# Patient Record
Sex: Female | Born: 1980 | Race: White | Hispanic: No | Marital: Married | Smoking: Never smoker
Health system: Southern US, Community
[De-identification: ages and names within clinical notes are randomized; demographics above are authoritative.]

## PROBLEM LIST (undated history)

## (undated) DIAGNOSIS — D649 Anemia, unspecified: Secondary | ICD-10-CM

## (undated) DIAGNOSIS — F419 Anxiety disorder, unspecified: Secondary | ICD-10-CM

## (undated) DIAGNOSIS — K219 Gastro-esophageal reflux disease without esophagitis: Secondary | ICD-10-CM

## (undated) DIAGNOSIS — F32A Depression, unspecified: Secondary | ICD-10-CM

## (undated) DIAGNOSIS — T7840XA Allergy, unspecified, initial encounter: Secondary | ICD-10-CM

## (undated) HISTORY — DX: Allergy, unspecified, initial encounter: T78.40XA

## (undated) HISTORY — DX: Anemia, unspecified: D64.9

## (undated) HISTORY — DX: Depression, unspecified: F32.A

## (undated) HISTORY — DX: Anxiety disorder, unspecified: F41.9

## (undated) HISTORY — PX: CHOLECYSTECTOMY: SHX55

---

## 2006-03-29 DIAGNOSIS — K219 Gastro-esophageal reflux disease without esophagitis: Secondary | ICD-10-CM | POA: Insufficient documentation

## 2014-03-28 DIAGNOSIS — E669 Obesity, unspecified: Secondary | ICD-10-CM | POA: Insufficient documentation

## 2015-08-10 DIAGNOSIS — J4520 Mild intermittent asthma, uncomplicated: Secondary | ICD-10-CM | POA: Insufficient documentation

## 2017-05-15 DIAGNOSIS — O034 Incomplete spontaneous abortion without complication: Secondary | ICD-10-CM | POA: Insufficient documentation

## 2017-11-18 ENCOUNTER — Other Ambulatory Visit: Payer: Self-pay | Admitting: Urology

## 2017-11-18 ENCOUNTER — Other Ambulatory Visit: Payer: Self-pay | Admitting: Physician Assistant

## 2017-11-18 DIAGNOSIS — R319 Hematuria, unspecified: Secondary | ICD-10-CM

## 2017-11-27 ENCOUNTER — Ambulatory Visit: Admission: RE | Admit: 2017-11-27 | Payer: Self-pay | Source: Ambulatory Visit

## 2017-12-31 ENCOUNTER — Ambulatory Visit: Payer: BC Managed Care – PPO

## 2018-01-10 ENCOUNTER — Ambulatory Visit
Admission: RE | Admit: 2018-01-10 | Discharge: 2018-01-10 | Disposition: A | Discharge status: Home / Self Care | Payer: BC Managed Care – PPO | Source: Ambulatory Visit | Attending: Physician Assistant | Admitting: Physician Assistant

## 2018-01-10 DIAGNOSIS — R319 Hematuria, unspecified: Secondary | ICD-10-CM

## 2018-01-13 ENCOUNTER — Ambulatory Visit
Admission: RE | Admit: 2018-01-13 | Discharge: 2018-01-13 | Disposition: A | Discharge status: Home / Self Care | Payer: BC Managed Care – PPO | Source: Ambulatory Visit | Attending: Physician Assistant | Admitting: Physician Assistant

## 2018-01-13 DIAGNOSIS — R319 Hematuria, unspecified: Secondary | ICD-10-CM | POA: Insufficient documentation

## 2018-01-13 DIAGNOSIS — R109 Unspecified abdominal pain: Secondary | ICD-10-CM | POA: Insufficient documentation

## 2019-09-13 IMAGING — CT CT ABD-PELV W/O CM
2 of 4 series · 16 of 46 positions shown, 18 images · non-contrast
Comparison: None.

CLINICAL DATA: Left-sided abdominal pain and swelling for 2-3
months. Microscopic hematuria.

EXAM:
CT ABDOMEN AND PELVIS WITHOUT CONTRAST
TECHNIQUE: Multidetector CT imaging of the abdomen and pelvis was performed
following the standard protocol without IV contrast.

[Series 2: renal stone · axial · 0.74mm/px · z∈[-1472,-1032]mm · 13 of 96 slices shown, 15 images (1 of 2)]
[im 4/96  soft-tissue]
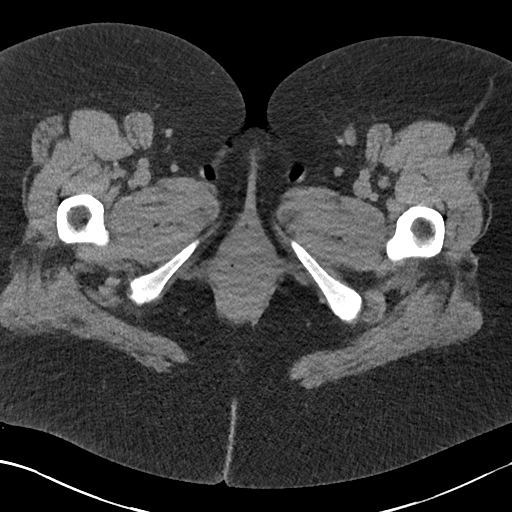
[im 4/96  bone]
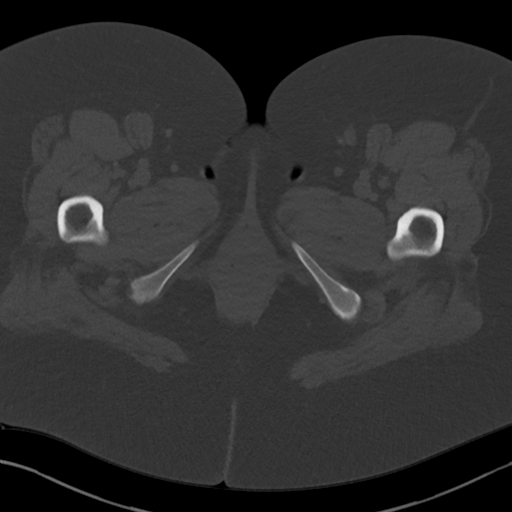
[im 12/96  soft-tissue]
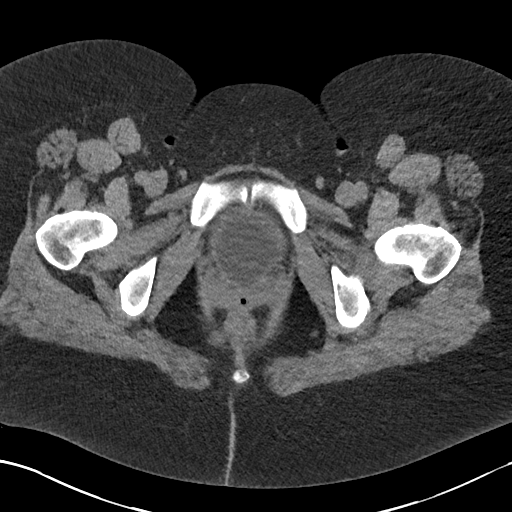
[im 20/96  soft-tissue]
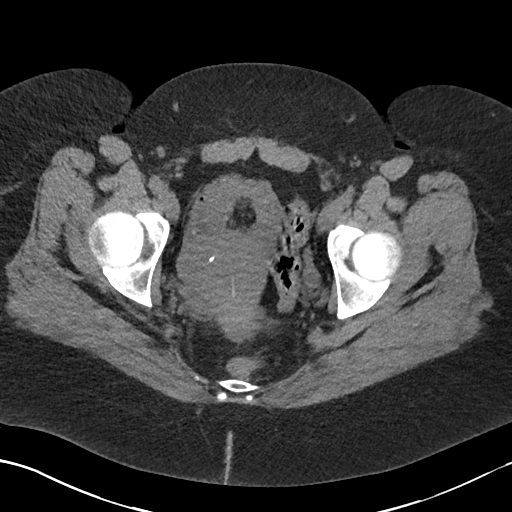
[im 28/96  soft-tissue]
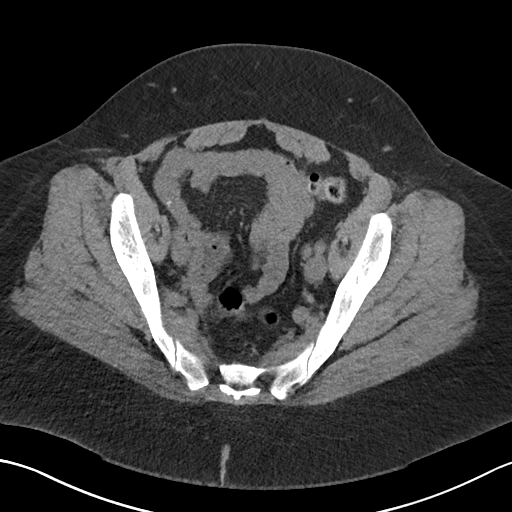
[im 32/96  soft-tissue]
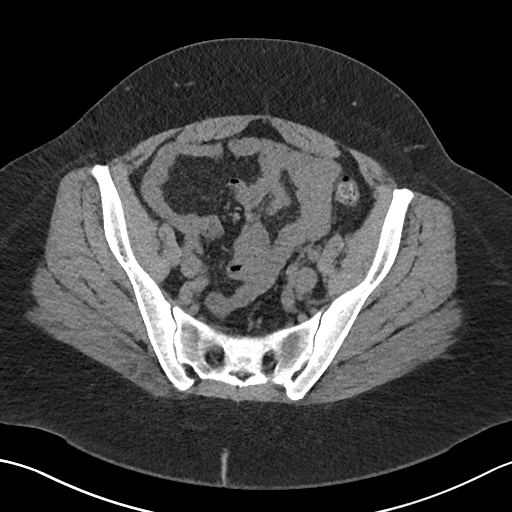
[im 40/96  soft-tissue]
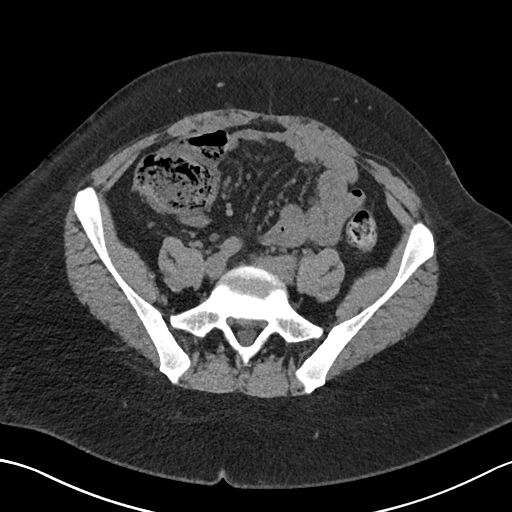
[im 48/96  soft-tissue]
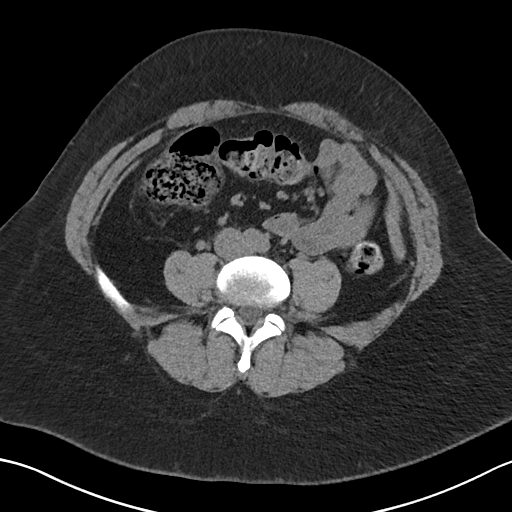
[im 56/96  soft-tissue]
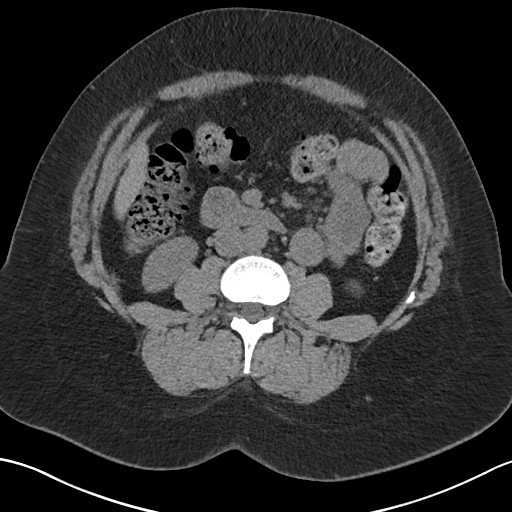
[im 64/96  soft-tissue]
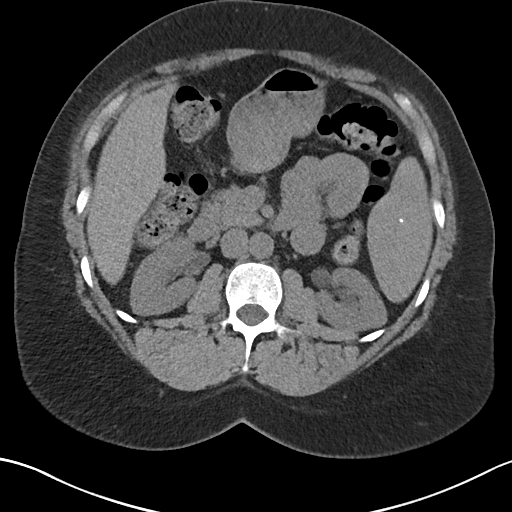
[im 64/96  bone]
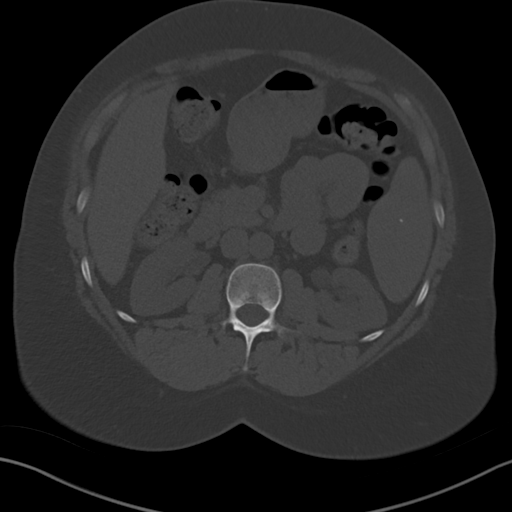
[im 68/96  soft-tissue]
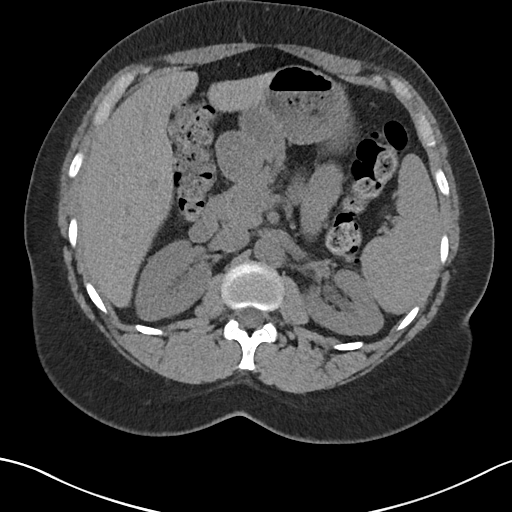
[im 76/96  soft-tissue]
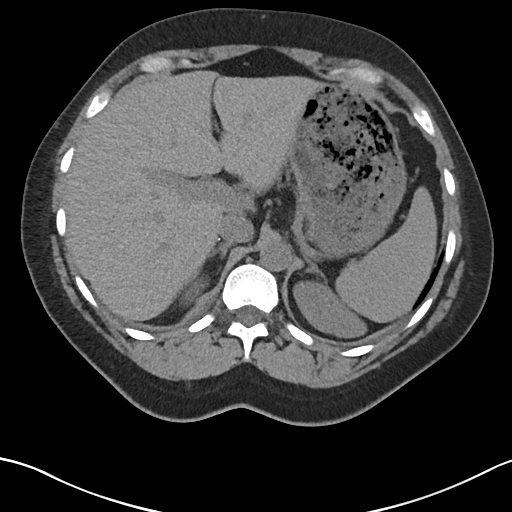
[im 84/96  soft-tissue]
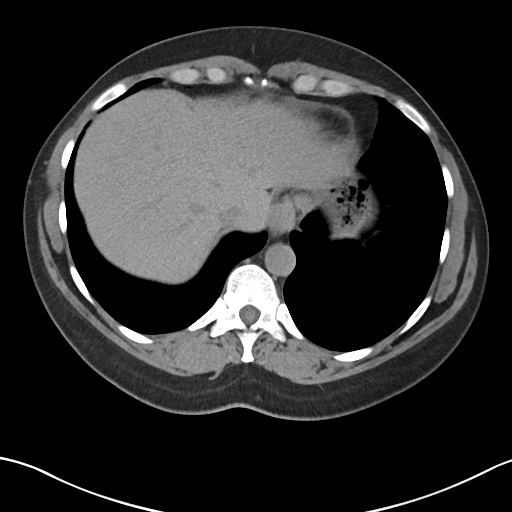
[im 92/96  soft-tissue]
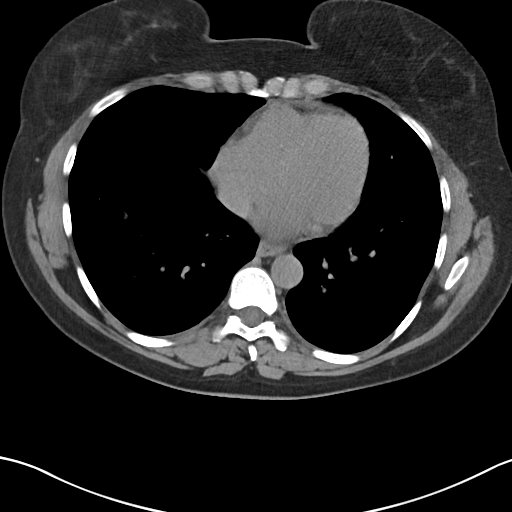

[Series 4: renal stone · coronal · 0.74mm/px · 3 of 189 slices shown (2 of 2)]
[im 63/189  soft-tissue]
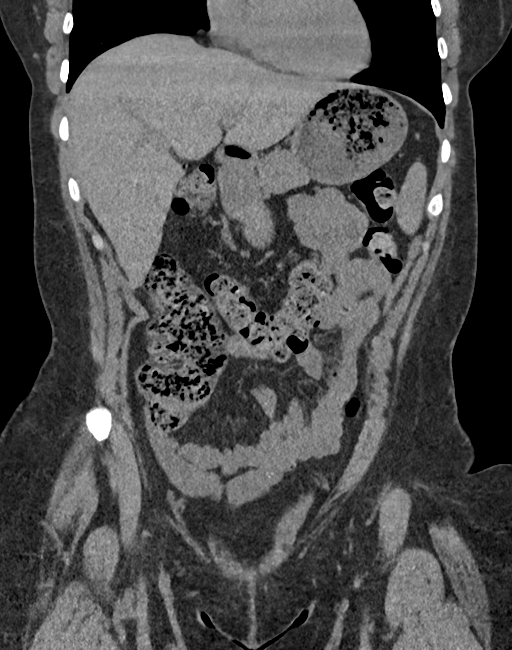
[im 84/189  soft-tissue]
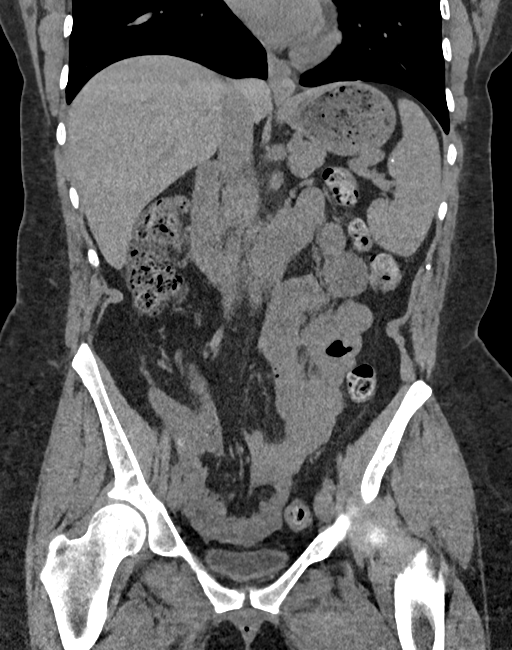
[im 105/189  soft-tissue]
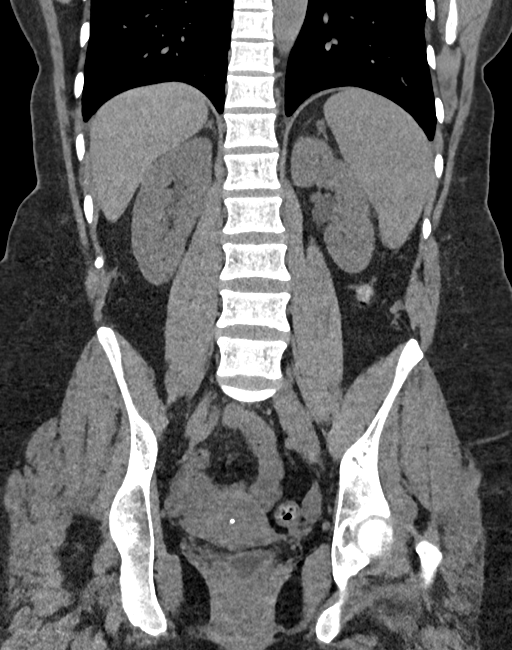

[16 of 46 positions shown; findings below may reference images not displayed]

FINDINGS: Lower chest: Clear lung bases.

Hepatobiliary: No focal liver abnormality is seen. Status post
cholecystectomy. No biliary dilatation.

Pancreas: Unremarkable.

Spleen: Small calcified granulomas.

Adrenals/Urinary Tract: Unremarkable adrenal glands. No evidence of
renal mass, urolithiasis, or hydroureteronephrosis. Unremarkable
bladder.

Stomach/Bowel: The stomach is within normal limits. There is no
evidence of bowel obstruction. The appendix is not clearly
identified, however no inflammatory changes are seen in the right
lower quadrant. A moderate amount of stool is noted in the proximal
colon.

Vascular/Lymphatic: Normal caliber of the abdominal aorta. No
enlarged lymph nodes.

Reproductive: IUD in place. No adnexal mass.

Other: No intraperitoneal free fluid.

Musculoskeletal: No acute osseous abnormality or suspicious osseous
lesion.
IMPRESSION: No acute abnormality identified in the abdomen or pelvis.

## 2021-05-06 ENCOUNTER — Other Ambulatory Visit: Payer: Self-pay

## 2021-05-06 ENCOUNTER — Ambulatory Visit
Admission: EM | Admit: 2021-05-06 | Discharge: 2021-05-06 | Disposition: A | Discharge status: Home / Self Care | Payer: 59

## 2021-05-06 ENCOUNTER — Encounter: Payer: Self-pay | Admitting: Emergency Medicine

## 2021-05-06 DIAGNOSIS — J029 Acute pharyngitis, unspecified: Secondary | ICD-10-CM

## 2021-05-06 DIAGNOSIS — H9203 Otalgia, bilateral: Secondary | ICD-10-CM

## 2021-05-06 DIAGNOSIS — R079 Chest pain, unspecified: Secondary | ICD-10-CM | POA: Diagnosis not present

## 2021-05-06 DIAGNOSIS — K219 Gastro-esophageal reflux disease without esophagitis: Secondary | ICD-10-CM

## 2021-05-06 HISTORY — DX: Gastro-esophageal reflux disease without esophagitis: K21.9

## 2021-05-06 NOTE — ED Provider Notes (Signed)
?[facility] ? ? ? ?CSN: [id number] ?Arrival date & time: 05/06/21  1058 ? ? ?  ? ?History   ?Chief Complaint ?Chief Complaint  ?Patient presents with  ? Abdominal Pain  ?  Rib Pain  ? Chest Pain  ? ? ?HPI ?Lauren Kidd is a 41 y.o. female.  Patient presents with intermittent pain in her left lower anterior rib cage x2 weeks.  No falls or injury.  No rash, bruising, redness, chest pain, shortness of breath.  The pain is worse with twisting and deep breaths.  She also reports bilateral ear pain, sore throat, and heartburn x2 weeks.  Treatment at home with Nexium; occasionally taking Tylenol but none today.  She has an appointment scheduled with gastroenterology in April and with her PCP on 05/15/2021.  She denies fever, chills, abdominal pain, vomiting, diarrhea, constipation, or other symptoms.  Her medical history includes GERD and morbid obesity. ? ?The history is provided by the patient and medical records.  ? ?Past Medical History:  ?Diagnosis Date  ? GERD (gastroesophageal reflux disease)   ? ? ?Patient Active Problem List  ? Diagnosis Date Noted  ? Incomplete abortion 05/15/2017  ? RAD (reactive airway disease) with wheezing, mild intermittent, uncomplicated 08/10/2015  ? Obesity 03/28/2014  ? Acid reflux 03/29/2006  ? ? ?History reviewed. No pertinent surgical history. ? ?OB History   ?No obstetric history on file. ?  ? ? ? ?Home Medications   ? ?Prior to Admission medications   ?Medication Sig Start Date End Date Taking? Authorizing Provider  ?albuterol (VENTOLIN HFA) 108 (90 Base) MCG/ACT inhaler Inhale into the lungs. 07/04/15  Yes [provider]  ?Esomeprazole Magnesium (NEXIUM 24HR CLEAR MINIS PO)  11/26/20  Yes [provider]  ?phentermine 37.5 MG capsule Take by mouth. 08/04/15  Yes [provider]  ?aspirin 325 MG tablet Take by mouth.    [provider]  ?terbinafine (LAMISIL) 250 MG tablet Take by mouth.    [provider]  ? ? ?Family  History ?History reviewed. No pertinent family history. ? ?Social History ?Social History  ? ?Tobacco Use  ? Smoking status: Never  ? Smokeless tobacco: Never  ?Substance Use Topics  ? Alcohol use: Never  ? Drug use: Never  ? ? ? ?Allergies   ?Patient has no known allergies. ? ? ?Review of Systems ?Review of Systems  ?Constitutional:  Negative for chills and fever.  ?HENT:  Positive for ear pain and sore throat.   ?Respiratory:  Negative for cough and shortness of breath.   ?Cardiovascular:  Negative for chest pain and palpitations.  ?Gastrointestinal:  Negative for abdominal pain, constipation, diarrhea, nausea and vomiting.  ?     Heartburn   ?Musculoskeletal:  Negative for back pain and gait problem.  ?     Left ribcage pain  ?Skin:  Negative for color change and rash.  ?All other systems reviewed and are negative. ? ? ?Physical Exam ?Triage Vital Signs ?ED Triage Vitals  ?Enc Vitals Group  ?   BP 05/06/21 1148 127/74  ?   Pulse Rate 05/06/21 1148 93  ?   Resp 05/06/21 1148 18  ?   Temp 05/06/21 1148 98.1 ?F (36.7 ?C)  ?   Temp src --   ?   SpO2 05/06/21 1148 97 %  ?   Weight --   ?   Height --   ?   Head Circumference --   ?   Peak Flow --   ?  Pain Score 05/06/21 1149 6  ?   Pain Loc --   ?   Pain Edu? --   ?   Excl. in GC? --   ? ?No data found. ? ?Updated Vital Signs ?BP 127/74   Pulse 93   Temp 98.1 ?F (36.7 ?C)   Resp 18   SpO2 97%  ? ?Visual Acuity ?Right Eye Distance:   ?Left Eye Distance:   ?Bilateral Distance:   ? ?Right Eye Near:   ?Left Eye Near:    ?Bilateral Near:    ? ?Physical Exam ?Vitals and nursing note reviewed.  ?Constitutional:   ?   General: She is not in acute distress. ?   Appearance: She is well-developed. She is obese. She is not ill-appearing.  ?HENT:  ?   Right Ear: Tympanic membrane normal.  ?   Left Ear: Tympanic membrane normal.  ?   Nose: Nose normal.  ?   Mouth/Throat:  ?   Mouth: Mucous membranes are moist.  ?   Pharynx: Oropharynx is clear.  ?Cardiovascular:  ?   Rate and  Rhythm: Normal rate and regular rhythm.  ?   Heart sounds: Normal heart sounds.  ?Pulmonary:  ?   Effort: Pulmonary effort is normal. No respiratory distress.  ?   Breath sounds: Normal breath sounds.  ?Chest:  ? ? ?Abdominal:  ?   General: Bowel sounds are normal.  ?   Palpations: Abdomen is soft.  ?   Tenderness: There is no abdominal tenderness. There is no guarding or rebound.  ?Musculoskeletal:     ?   General: No swelling, tenderness, deformity or signs of injury. Normal range of motion.  ?   Cervical back: Neck supple.  ?   Comments: Ribcage nontender.  ?Skin: ?   General: Skin is warm and dry.  ?   Capillary Refill: Capillary refill takes less than 2 seconds.  ?   Findings: No bruising, erythema, lesion or rash.  ?Neurological:  ?   General: No focal deficit present.  ?   Mental Status: She is alert and oriented to person, place, and time.  ?   Gait: Gait normal.  ?Psychiatric:     ?   Mood and Affect: Mood normal.     ?   Behavior: Behavior normal.  ? ? ? ?UC Treatments / Results  ?Labs ?(all labs ordered are listed, but only abnormal results are displayed) ?Labs Reviewed - No data to display ? ?EKG ? ? ?Radiology ?No results found. ? ?Procedures ?Procedures (including critical care time) ? ?Medications Ordered in UC ?Medications - No data to display ? ?Initial Impression / Assessment and Plan / UC Course  ?I have reviewed the triage vital signs and the nursing notes. ? ?Pertinent labs & imaging results that were available during my care of the patient were reviewed by me and considered in my medical decision making (see chart for details). ? ?  ?Atypical chest pain, GERD, otalgia, sore throat.  EKG shows sinus rhythm, rate 66, no ST elevation, no previous to compare.  ED precautions discussed; instructed patient to go to the ED if she has chest pain or other concerning symptoms.  Instructed her to follow-up with her PCP on Monday.  Patient has an appointment scheduled with her PCP on 05/15/2021 and with a  gastroenterologist in April.  Instructed her to continue taking Nexium.  Education provided on chest pain and GERD.  Patient agrees to plan of care. ? ? ?Final Clinical  Impressions(s) / UC Diagnoses  ? ?Final diagnoses:  ?Chest pain, unspecified type  ?Gastroesophageal reflux disease, unspecified whether esophagitis present  ?Otalgia of both ears  ?Sore throat  ? ? ? ?Discharge Instructions   ? ?  ?Go to the emergency department if you have chest pain or other concerning symptoms.   ? ?Follow-up with your primary care provider on Monday. ? ? ? ? ?ED Prescriptions   ?None ?  ? ?PDMP not reviewed this encounter. ?  ?Mickie Bailate, Lancelot Alyea H, NP ?05/06/21 1235 ? ?

## 2021-05-06 NOTE — ED Triage Notes (Signed)
Pt here with left sided rib/LUQ pain x 2 weeks that is intermittent in nature and worse with movement.  ?

## 2021-05-06 NOTE — Discharge Instructions (Addendum)
Go to the emergency department if you have chest pain or other concerning symptoms.   ? ?Follow-up with your primary care provider on Monday. ?

## 2021-05-08 ENCOUNTER — Encounter: Payer: 59 | Admitting: Family Medicine

## 2021-05-15 ENCOUNTER — Encounter: Payer: Self-pay | Admitting: Registered Nurse

## 2021-05-15 ENCOUNTER — Ambulatory Visit (INDEPENDENT_AMBULATORY_CARE_PROVIDER_SITE_OTHER): Payer: 59 | Admitting: Registered Nurse

## 2021-05-15 VITALS — BP 118/84 | HR 77 | Temp 98.1°F | Resp 18 | Ht 67.0 in | Wt 297.4 lb

## 2021-05-15 DIAGNOSIS — R1012 Left upper quadrant pain: Secondary | ICD-10-CM | POA: Diagnosis not present

## 2021-05-15 DIAGNOSIS — K219 Gastro-esophageal reflux disease without esophagitis: Secondary | ICD-10-CM | POA: Diagnosis not present

## 2021-05-15 DIAGNOSIS — R197 Diarrhea, unspecified: Secondary | ICD-10-CM

## 2021-05-15 DIAGNOSIS — Z6841 Body Mass Index (BMI) 40.0 and over, adult: Secondary | ICD-10-CM

## 2021-05-15 LAB — CBC WITH DIFFERENTIAL/PLATELET
Basophils Absolute: 0 10*3/uL (ref 0.0–0.1)
Basophils Relative: 0.6 % (ref 0.0–3.0)
Eosinophils Absolute: 0.2 10*3/uL (ref 0.0–0.7)
Eosinophils Relative: 3.8 % (ref 0.0–5.0)
HCT: 41.4 % (ref 36.0–46.0)
Hemoglobin: 13.7 g/dL (ref 12.0–15.0)
Lymphocytes Relative: 15.7 % (ref 12.0–46.0)
Lymphs Abs: 1 10*3/uL (ref 0.7–4.0)
MCHC: 33.1 g/dL (ref 30.0–36.0)
MCV: 89.4 fl (ref 78.0–100.0)
Monocytes Absolute: 0.4 10*3/uL (ref 0.1–1.0)
Monocytes Relative: 5.7 % (ref 3.0–12.0)
Neutro Abs: 4.7 10*3/uL (ref 1.4–7.7)
Neutrophils Relative %: 74.2 % (ref 43.0–77.0)
Platelets: 174 10*3/uL (ref 150.0–400.0)
RBC: 4.63 Mil/uL (ref 3.87–5.11)
RDW: 14.2 % (ref 11.5–15.5)
WBC: 6.3 10*3/uL (ref 4.0–10.5)

## 2021-05-15 LAB — COMPREHENSIVE METABOLIC PANEL
ALT: 13 U/L (ref 0–35)
AST: 19 U/L (ref 0–37)
Albumin: 4.4 g/dL (ref 3.5–5.2)
Alkaline Phosphatase: 55 U/L (ref 39–117)
BUN: 8 mg/dL (ref 6–23)
CO2: 26 mEq/L (ref 19–32)
Calcium: 9.1 mg/dL (ref 8.4–10.5)
Chloride: 102 mEq/L (ref 96–112)
Creatinine, Ser: 0.77 mg/dL (ref 0.40–1.20)
GFR: 96.33 mL/min (ref >60.00)
Glucose, Bld: 72 mg/dL (ref 70–99)
Potassium: 4.1 mEq/L (ref 3.5–5.1)
Sodium: 136 mEq/L (ref 135–145)
Total Bilirubin: 0.6 mg/dL (ref 0.2–1.2)
Total Protein: 6.7 g/dL (ref 6.0–8.3)

## 2021-05-15 LAB — LIPID PANEL
Cholesterol: 185 mg/dL (ref 0–200)
HDL: 52.4 mg/dL (ref >39.00)
LDL Cholesterol: 117 mg/dL — ABNORMAL HIGH (ref 0–99)
NonHDL: 132.12
Total CHOL/HDL Ratio: 4
Triglycerides: 77 mg/dL (ref 0.0–149.0)
VLDL: 15.4 mg/dL (ref 0.0–40.0)

## 2021-05-15 LAB — HEMOGLOBIN A1C: Hgb A1c MFr Bld: 5.2 % (ref 4.6–6.5)

## 2021-05-15 LAB — TSH: TSH: 1.36 u[IU]/mL (ref 0.35–5.50)

## 2021-05-15 MED ORDER — DEXLANSOPRAZOLE 60 MG PO CPDR: 60.0000 mg | DELAYED_RELEASE_CAPSULE | Freq: Every day | ORAL | 1 refills | Status: DC

## 2021-05-15 MED ORDER — PHENTERMINE HCL 30 MG PO CAPS: 30.0000 mg | ORAL_CAPSULE | ORAL | 0 refills | Status: DC

## 2021-05-15 MED ORDER — SUCRALFATE 1 G PO TABS: 1.0000 g | ORAL_TABLET | Freq: Three times a day (TID) | ORAL | 0 refills | Status: DC

## 2021-05-15 NOTE — Progress Notes (Signed)
? ?New Patient Office Visit ? ?Subjective:  ?Patient ID: Lauren Kidd, female    DOB: 1980/06/15  Age: 41 y.o. MRN: [id number] ? ?CC:  ?Chief Complaint  ?Patient presents with  ? New Patient (Initial Visit)  ?  Patient states she she is here to establish care. Patient states she is here to discuss weight loss and also stomach pain she has an appointment with Laurette Schimke at the end of April.  ? ? ?HPI ?Lauren Kidd presents for visit to est care. ? ?Abdominal pain ?Ongoing acid reflux, plus on and off ULQ pain. ?Worse with acidic foods.  ?Does take nexium daily. Had used dexilant in the past.  ?No changes to BM - consistently inconsistent. Watery at times. No constipation unless taking imodium. ?No blood or mucus in stool.  ?No nv.  ?No unexpected weight changes.  ? ?Weight management ?Has been on phentermine 37.5 mg po qd in past ?Stimulant too much - pounding heart beat, sleep disturbance ?Interested in lower dose ?We did discuss wegovy in depth and may consider a change in the future.  ? ?Past Medical History:  ?Diagnosis Date  ? Allergy 2008  ? Anemia 2016  ? Anxiety 2016  ? GERD (gastroesophageal reflux disease)   ? ? ?Past Surgical History:  ?Procedure Laterality Date  ? CHOLECYSTECTOMY  2009  ? ? ?Family History  ?Problem Relation Age of Onset  ? Hypertension Maternal Grandmother   ? Depression Other   ? ? ?Social History  ? ?Socioeconomic History  ? Marital status: Married  ?  Spouse name: Not on file  ? Number of children: 1  ? Years of education: Not on file  ? Highest education level: Not on file  ?Occupational History  ? Not on file  ?Tobacco Use  ? Smoking status: Never  ? Smokeless tobacco: Never  ?Vaping Use  ? Vaping Use: Never used  ?Substance and Sexual Activity  ? Alcohol use: Yes  ?  Alcohol/week: 15.0 standard drinks  ?  Types: 15 Shots of liquor per week  ?  Comment: Not currently drinking but was for about two years  ? Drug use: Never  ? Sexual activity: Yes  ?Other Topics Concern  ? Not on file   ?Social History Narrative  ? Not on file  ? ?Social Determinants of Health  ? ?Financial Resource Strain: Not on file  ?Food Insecurity: Not on file  ?Transportation Needs: Not on file  ?Physical Activity: Not on file  ?Stress: Not on file  ?Social Connections: Not on file  ?Intimate Partner Violence: Not on file  ? ? ?ROS ?Review of Systems  ?Constitutional: Negative.   ?HENT: Negative.    ?Eyes: Negative.   ?Respiratory: Negative.    ?Cardiovascular: Negative.   ?Gastrointestinal: Negative.   ?Genitourinary: Negative.   ?Musculoskeletal: Negative.   ?Skin: Negative.   ?Neurological: Negative.   ?Psychiatric/Behavioral: Negative.    ? ?Objective:  ? ?Today's Vitals: BP 118/84   Pulse 77   Temp 98.1 ?F (36.7 ?C) (Temporal)   Resp 18   Ht 5\' 7"  (1.702 m)   Wt 297 lb 6.4 oz (134.9 kg)   SpO2 99%   BMI 46.58 kg/m?  ? ?Physical Exam ?Vitals and nursing note reviewed.  ?Constitutional:   ?   General: She is not in acute distress. ?   Appearance: Normal appearance. She is not ill-appearing, toxic-appearing or diaphoretic.  ?Cardiovascular:  ?   Rate and Rhythm: Normal rate and regular rhythm.  ?  Pulses: Normal pulses.  ?   Heart sounds: Normal heart sounds. No murmur heard. ?  No friction rub. No gallop.  ?Pulmonary:  ?   Effort: Pulmonary effort is normal. No respiratory distress.  ?   Breath sounds: Normal breath sounds. No stridor. No wheezing, rhonchi or rales.  ?Chest:  ?   Chest wall: No tenderness.  ?Skin: ?   General: Skin is warm and dry.  ?   Capillary Refill: Capillary refill takes less than 2 seconds.  ?Neurological:  ?   General: No focal deficit present.  ?   Mental Status: She is alert and oriented to person, place, and time. Mental status is at baseline.  ?Psychiatric:     ?   Mood and Affect: Mood normal.     ?   Behavior: Behavior normal.     ?   Thought Content: Thought content normal.     ?   Judgment: Judgment normal.  ? ? ?Assessment & Plan:  ? ?Problem List Items Addressed This Visit   ? ?   ? Digestive  ? Acid reflux - Primary  ? Relevant Medications  ? dexlansoprazole (DEXILANT) 60 MG capsule  ? sucralfate (CARAFATE) 1 g tablet  ?  ? Other  ? Obesity  ? Relevant Medications  ? phentermine 30 MG capsule  ? ?Other Visit Diagnoses   ? ? LUQ abdominal pain      ? Relevant Orders  ? CBC with Differential/Platelet  ? Comprehensive metabolic panel  ? Lipid panel  ? TSH  ? Hemoglobin A1c  ? Tissue transglutaminase, IgA  ? Intermittent diarrhea      ? Relevant Orders  ? CBC with Differential/Platelet  ? Comprehensive metabolic panel  ? Lipid panel  ? TSH  ? Hemoglobin A1c  ? Tissue transglutaminase, IgA  ? ?  ? ? ?Outpatient Encounter Medications as of 05/15/2021  ?Medication Sig  ? aspirin 325 MG tablet Take by mouth.  ? dexlansoprazole (DEXILANT) 60 MG capsule Take 1 capsule (60 mg total) by mouth daily.  ? Esomeprazole Magnesium (NEXIUM 24HR CLEAR MINIS PO)   ? phentermine 30 MG capsule Take 1 capsule (30 mg total) by mouth every morning.  ? sucralfate (CARAFATE) 1 g tablet Take 1 tablet (1 g total) by mouth 4 (four) times daily -  with meals and at bedtime.  ? [DISCONTINUED] albuterol (VENTOLIN HFA) 108 (90 Base) MCG/ACT inhaler Inhale into the lungs.  ? [DISCONTINUED] phentermine 37.5 MG capsule Take by mouth.  ? [DISCONTINUED] terbinafine (LAMISIL) 250 MG tablet Take by mouth.  ? ?No facility-administered encounter medications on file as of 05/15/2021.  ? ? ?Follow-up: Return in about 6 months (around 11/15/2021) for CPE and labs.  ? ?PLAN ?Dexilant 60mg  po qd and sucralfate po qid for GERD.  ?Phetnermine 30mg  po qd ?Labs collected. Will follow up with the patient as warranted. ?Patient encouraged to call clinic with any questions, comments, or concerns. ? ? , NP ? ?

## 2021-05-15 NOTE — Patient Instructions (Addendum)
Lauren Kidd -  ? ?Great to meet you ? ?Call with any concerns ? ?Phentermine, dexilant, and sucralfate sent. ? ?I will call with any urgent lab results ? ?Thanks, ? ?Rich  ? ? ? ?If you have lab work done today you will be contacted with your lab results within the next 2 weeks.  If you have not heard from [place] then please contact us. The fastest way to get your results is to register for My Chart. ? ? ?IF you received an x-ray today, you will receive an invoice from [facility]. Please contact [facility] at [phone] with questions or concerns regarding your invoice.  ? ?IF you received labwork today, you will receive an invoice from [facility]. Please contact [facility] at [phone] with questions or concerns regarding your invoice.  ? ?Our billing staff will not be able to assist you with questions regarding bills from these companies. ? ?You will be contacted with the lab results as soon as they are available. The fastest way to get your results is to activate your My Chart account. Instructions are located on the last page of this paperwork. If you have not heard from [place] regarding the results in 2 weeks, please contact this office. ?  ? ? ?

## 2021-05-16 LAB — TISSUE TRANSGLUTAMINASE, IGA: (tTG) Ab, IgA: <1 U/mL

## 2021-06-22 ENCOUNTER — Ambulatory Visit: Payer: 59 | Admitting: Gastroenterology

## 2021-11-13 ENCOUNTER — Encounter: Payer: 59 | Admitting: Registered Nurse

## 2022-04-04 NOTE — Progress Notes (Deleted)
42 y.o. No obstetric history on file. Married Caucasian female here for NEW GYN/annual exam.    PCP:     No LMP recorded.           Sexually active: {yes no:314532}  The current method of family planning is {contraception:315051}.    Exercising: {yes no:314532}  {types:19826} Smoker:  {YES NO:22349}  Health Maintenance: Pap:  *** History of abnormal Pap:  {YES NO:22349} MMG:  *** Colonoscopy:  *** BMD:   ***  Result  *** TDaP:  *** Gardasil:   {YES NO:22349} HIV: Hep C: Screening Labs:  Hb today: ***, Urine today: ***   reports that she has never smoked. She has never used smokeless tobacco. She reports current alcohol use of about 15.0 standard drinks of alcohol per week. She reports that she does not use drugs.  Past Medical History:  Diagnosis Date   Allergy 2008   Anemia 2016   Anxiety 2016   GERD (gastroesophageal reflux disease)     Past Surgical History:  Procedure Laterality Date   CHOLECYSTECTOMY  2009    Current Outpatient Medications  Medication Sig Dispense Refill   aspirin 325 MG tablet Take by mouth.     dexlansoprazole (DEXILANT) 60 MG capsule Take 1 capsule (60 mg total) by mouth daily. 90 capsule 1   Esomeprazole Magnesium (NEXIUM 24HR CLEAR MINIS PO)      phentermine 30 MG capsule Take 1 capsule (30 mg total) by mouth every morning. 90 capsule 0   sucralfate (CARAFATE) 1 g tablet Take 1 tablet (1 g total) by mouth 4 (four) times daily -  with meals and at bedtime. 40 tablet 0   No current facility-administered medications for this visit.    Family History  Problem Relation Age of Onset   Hypertension Maternal Grandmother    Depression Other     Review of Systems  Exam:   There were no vitals taken for this visit.    General appearance: alert, cooperative and appears stated age Head: normocephalic, without obvious abnormality, atraumatic Neck: no adenopathy, supple, symmetrical, trachea midline and thyroid normal to inspection and  palpation Lungs: clear to auscultation bilaterally Breasts: normal appearance, no masses or tenderness, No nipple retraction or dimpling, No nipple discharge or bleeding, No axillary adenopathy Heart: regular rate and rhythm Abdomen: soft, non-tender; no masses, no organomegaly Extremities: extremities normal, atraumatic, no cyanosis or edema Skin: skin color, texture, turgor normal. No rashes or lesions Lymph nodes: cervical, supraclavicular, and axillary nodes normal. Neurologic: grossly normal  Pelvic: External genitalia:  no lesions              No abnormal inguinal nodes palpated.              Urethra:  normal appearing urethra with no masses, tenderness or lesions              Bartholins and Skenes: normal                 Vagina: normal appearing vagina with normal color and discharge, no lesions              Cervix: no lesions              Pap taken: {yes no:314532} Bimanual Exam:  Uterus:  normal size, contour, position, consistency, mobility, non-tender              Adnexa: no mass, fullness, tenderness  Rectal exam: {yes no:314532}.  Confirms.              Anus:  normal sphincter tone, no lesions  Chaperone was present for exam:  ***  Assessment:   Well woman visit with gynecologic exam.   Plan: Mammogram screening discussed. Self breast awareness reviewed. Pap and HR HPV as above. Guidelines for Calcium, Vitamin D, regular exercise program including cardiovascular and weight bearing exercise.   Follow up annually and prn.   Additional counseling given.  {yes [id number]. _______ minutes face to face time of which over 50% was spent in counseling.    After visit summary provided.

## 2022-04-10 ENCOUNTER — Other Ambulatory Visit (HOSPITAL_COMMUNITY): Payer: Self-pay | Admitting: Obstetrics and Gynecology

## 2022-04-10 DIAGNOSIS — Z1231 Encounter for screening mammogram for malignant neoplasm of breast: Secondary | ICD-10-CM

## 2022-04-11 ENCOUNTER — Inpatient Hospital Stay (HOSPITAL_COMMUNITY): Admission: RE | Admit: 2022-04-11 | Payer: 59 | Source: Ambulatory Visit

## 2022-04-11 ENCOUNTER — Encounter (HOSPITAL_COMMUNITY): Payer: Self-pay

## 2022-04-11 DIAGNOSIS — Z1231 Encounter for screening mammogram for malignant neoplasm of breast: Secondary | ICD-10-CM

## 2022-04-17 ENCOUNTER — Encounter: Payer: 59 | Admitting: Obstetrics and Gynecology

## 2022-05-02 ENCOUNTER — Encounter: Payer: Self-pay | Admitting: Radiology

## 2022-05-02 ENCOUNTER — Other Ambulatory Visit (HOSPITAL_COMMUNITY)
Admission: RE | Admit: 2022-05-02 | Discharge: 2022-05-02 | Disposition: A | Discharge status: Home / Self Care | Payer: 59 | Source: Ambulatory Visit | Attending: Radiology | Admitting: Radiology

## 2022-05-02 ENCOUNTER — Ambulatory Visit: Payer: 59 | Admitting: Radiology

## 2022-05-02 VITALS — BP 120/84 | Ht 67.0 in | Wt 307.0 lb

## 2022-05-02 DIAGNOSIS — Z01419 Encounter for gynecological examination (general) (routine) without abnormal findings: Secondary | ICD-10-CM

## 2022-05-02 DIAGNOSIS — Z975 Presence of (intrauterine) contraceptive device: Secondary | ICD-10-CM

## 2022-05-02 NOTE — Progress Notes (Signed)
   Lauren Kidd [birth year] [id number]   History:  42 y.o. G3P1 presents for annual exam as a new patient. Mirena for [facility], inserted 05/27/17. Last pap 2019. Has a PCP appt next month. No gyn concerns.   Gynecologic History No LMP recorded.   Contraception/Family planning: IUD Sexually active: yes Last Pap: 2019. Results were: normal Last mammogram: never, scheduled next month  Obstetric History OB History  Gravida Para Term Preterm AB Living  '3 1       1  '$ SAB IAB Ectopic Multiple Live Births               # Outcome Date GA Lbr Len/2nd Weight Sex Delivery Anes PTL Lv  3 Gravida           2 Gravida           1 Para              The following portions of the patient's history were reviewed and updated as appropriate: allergies, current medications, past family history, past medical history, past social history, past surgical history, and problem list.  Review of Systems Pertinent items noted in HPI and remainder of comprehensive ROS otherwise negative.   Past medical history, past surgical history, family history and social history were all reviewed and documented in the EPIC chart.   Exam:  Vitals:   05/02/22 0758  BP: 120/84  Weight: (!) 307 lb (139.3 kg)  Height: '5\' 7"'$  (1.702 m)   Body mass index is 48.08 kg/m.  General appearance:  Normal, morbidly obese Thyroid:  Symmetrical, normal in size, without palpable masses or nodularity. Respiratory  Auscultation:  Clear without wheezing or rhonchi Cardiovascular  Auscultation:  Regular rate, without rubs, murmurs or gallops  Edema/varicosities:  Not grossly evident Abdominal  Soft,nontender, without masses, guarding or rebound.  Liver/spleen:  No organomegaly noted  Hernia:  None appreciated  Skin  Inspection:  Grossly normal Breasts: Examined lying and sitting.   Right: Without masses, retractions, nipple discharge or axillary adenopathy.   Left: Without masses, retractions, nipple discharge or axillary  adenopathy. Genitourinary   Inguinal/mons:  Normal without inguinal adenopathy  External genitalia:  Normal appearing vulva with no masses, tenderness, or lesions  BUS/Urethra/Skene's glands:  Normal without masses or exudate  Vagina:  Normal appearing with normal color and discharge, no lesions  Cervix:  Normal appearing without discharge or lesions. IUD strings seen 2cm from os  Uterus:  Normal in size, shape and contour.  Mobile, nontender  Adnexa/parametria:     Rt: Normal in size, without masses or tenderness.   Lt: Normal in size, without masses or tenderness.  Anus and perineum: Normal   Patient informed chaperone available to be present for breast and pelvic exam. Patient has requested no chaperone to be present. Patient has been advised what will be completed during breast and pelvic exam.   Assessment/Plan:   1. Well woman exam with routine gynecological exam - Mammo scheduled 05/28/22 - Cytology - PAP( [facility])  2. IUD (intrauterine device) in place May leave Mirena in place until 05/27/25     Discussed SBE, colonoscopy and pap screening as directed/appropriate. Recommend 17mns of exercise weekly, including weight bearing exercise. Encouraged the use of seatbelts and sunscreen. Return in 1 year for annual or as needed.   CRubbie BattiestB WHNP-BC 8:05 AM 05/02/2022

## 2022-05-07 ENCOUNTER — Ambulatory Visit (HOSPITAL_COMMUNITY): Payer: 59

## 2022-05-07 LAB — CYTOLOGY - PAP
Comment: NEGATIVE
Diagnosis: UNDETERMINED — AB
High risk HPV: NEGATIVE

## 2022-05-23 ENCOUNTER — Ambulatory Visit: Payer: 59 | Admitting: Gastroenterology

## 2022-05-23 ENCOUNTER — Encounter: Payer: Self-pay | Admitting: Gastroenterology

## 2022-05-23 VITALS — BP 124/86 | HR 80 | Temp 98.2°F | Ht 67.0 in | Wt 312.2 lb

## 2022-05-23 DIAGNOSIS — K219 Gastro-esophageal reflux disease without esophagitis: Secondary | ICD-10-CM | POA: Diagnosis not present

## 2022-05-23 DIAGNOSIS — R1012 Left upper quadrant pain: Secondary | ICD-10-CM

## 2022-05-23 NOTE — Progress Notes (Signed)
Lauren Darby, MD [address]  [place]  [place], [place] [address]  Main: [phone]  Fax: [phone]    Gastroenterology Consultation  Referring Provider:     Maximiano Coss, NP Primary Care Physician: Lauren Kidd Primary Gastroenterologist:  Dr. Cephas Kidd Reason for Consultation: Chronic GERD, left lower rib cage pain        HPI:   Lauren Kidd is a 42 y.o. female referred by Dr. Lin Landsman, MD  for consultation & management of chronic GERD and epigastric pain.  Patient has history of heartburn, reflux, previously treated with Dexilant daily for 3 months.  Her PCP has been changed.  Currently taking over-the-counter Nexium which helps with heartburn significantly.  However, she reports having pain underneath her left lower rib cage.  She denies any other symptoms including bloating or epigastric discomfort.  She reports having regular bowel movements.  She is trying to avoid late dinner which helps with reflux symptoms.  Her labs including TTG IgA, HbA1c, TSH, lipid panel, CMP and CBC are unremarkable  She does not smoke.  She used to drink 2 glasses of whiskey or wine daily, has cut back significantly  NSAIDs: None  Antiplts/Anticoagulants/Anti thrombotics: None  GI Procedures: Reports undergoing upper endoscopy 10 years ago  Past Medical History:  Diagnosis Date   Allergy 2008   Anemia 2016   Anxiety 2016   Depression    GERD (gastroesophageal reflux disease)     Past Surgical History:  Procedure Laterality Date   CHOLECYSTECTOMY  2009     Current Outpatient Medications:    acetaminophen (TYLENOL) 160 mg/5 mL SOLN, Take by mouth., Disp: , Rfl:    Calcium Carbonate Antacid (TUMS PO), Take by mouth., Disp: , Rfl:    Esomeprazole Magnesium (NEXIUM 24HR CLEAR MINIS PO), , Disp: , Rfl:    sucralfate (CARAFATE) 1 g tablet, Take 1 tablet (1 g total) by mouth 4 (four) times daily -  with meals and at bedtime., Disp: 40 tablet, Rfl: 0    aspirin 325 MG tablet, Take by mouth. (Patient not taking: Reported on 05/02/2022), Disp: , Rfl:    Family History  Problem Relation Age of Onset   Depression Mother    Hypertension Maternal Grandmother    Depression Maternal Grandfather    Depression Other      Social History   Tobacco Use   Smoking status: Never    Passive exposure: Past   Smokeless tobacco: Never  Vaping Use   Vaping Use: Never used  Substance Use Topics   Alcohol use: Not Currently    Alcohol/week: 10.0 standard drinks of alcohol    Types: 10 Standard drinks or equivalent per week   Drug use: Never    Allergies as of 05/23/2022   (No Known Allergies)    Review of Systems:    All systems reviewed and negative except where noted in HPI.   Physical Exam:  BP 124/86   Pulse 80   Temp 98.2 F (36.8 C) (Oral)   Ht 5\' 7"  (1.702 m)   Wt (!) 312 lb 4 oz (141.6 kg)   BMI 48.91 kg/m  No LMP recorded.  General:   Alert,  Well-developed, well-nourished, pleasant and cooperative in NAD Head:  Normocephalic and atraumatic. Eyes:  Sclera clear, no icterus.   Conjunctiva pink. Ears:  Normal auditory acuity. Nose:  No deformity, discharge, or lesions. Mouth:  No deformity or lesions,oropharynx pink & moist. Neck:  Supple; no  masses or thyromegaly. Lungs:  Respirations even and unlabored.  Clear throughout to auscultation.   No wheezes, crackles, or rhonchi. No acute distress. Heart:  Regular rate and rhythm; no murmurs, clicks, rubs, or gallops. Abdomen:  Normal bowel sounds. Soft, non-tender and non-distended without masses, hepatosplenomegaly or hernias noted.  No guarding or rebound tenderness.   Rectal: Not performed Msk:  Symmetrical without gross deformities. Good, equal movement & strength bilaterally. Pulses:  Normal pulses noted. Extremities:  No clubbing or edema.  No cyanosis. Neurologic:  Alert and oriented x3;  grossly normal neurologically. Skin:  Intact without significant lesions or rashes.  No jaundice Psych:  Alert and cooperative. Normal mood and affect.  Imaging Studies: Reviewed  Assessment and Plan:   Lauren Kidd is a 42 y.o. female with morbid obesity, BMI 45, s/p cholecystectomy, chronic GERD, previously treated with Dexilant is seen in consultation for follow-up of GERD and left lower rib cage pain/indigestion  Recommend EGD for further evaluation Continue OTC Nexium daily   Follow up as needed   Lauren Darby, MD

## 2022-05-24 NOTE — Addendum Note (Signed)
Addended by: Jacqualin Combes on: 05/24/2022 08:08 AM   Modules accepted: Orders

## 2022-05-28 ENCOUNTER — Ambulatory Visit (HOSPITAL_COMMUNITY): Payer: 59

## 2022-05-28 ENCOUNTER — Encounter (HOSPITAL_COMMUNITY): Payer: Self-pay

## 2022-05-28 NOTE — Progress Notes (Unsigned)
   There were no vitals taken for this visit.   Subjective:    Patient ID: Lauren Kidd, female    DOB: August 02, 1980, 42 y.o.   MRN: [id number]  HPI: Lauren Kidd is a 42 y.o. female  No chief complaint on file.  Establish care: her last physical was ***.  Medical history includes ***.  Family history includes ***.  Health maintenance ***.   Relevant past medical, surgical, family and social history reviewed and updated as indicated. Interim medical history since our last visit reviewed. Allergies and medications reviewed and updated.  Review of Systems  Constitutional: Negative for fever or weight change.  Respiratory: Negative for cough and shortness of breath.   Cardiovascular: Negative for chest pain or palpitations.  Gastrointestinal: Negative for abdominal pain, no bowel changes.  Musculoskeletal: Negative for gait problem or joint swelling.  Skin: Negative for rash.  Neurological: Negative for dizziness or headache.  No other specific complaints in a complete review of systems (except as listed in HPI above).      Objective:    There were no vitals taken for this visit.  Wt Readings from Last 3 Encounters:  05/23/22 (!) 312 lb 4 oz (141.6 kg)  05/02/22 (!) 307 lb (139.3 kg)  05/15/21 297 lb 6.4 oz (134.9 kg)    Physical Exam  Constitutional: Patient appears well-developed and well-nourished. Obese *** No distress.  HEENT: head atraumatic, normocephalic, pupils equal and reactive to light, ears ***, neck supple, throat within normal limits Cardiovascular: Normal rate, regular rhythm and normal heart sounds.  No murmur heard. No BLE edema. Pulmonary/Chest: Effort normal and breath sounds normal. No respiratory distress. Abdominal: Soft.  There is no tenderness. Psychiatric: Patient has a normal mood and affect. behavior is normal. Judgment and thought content normal.   Results for orders placed or performed in visit on 05/02/22  Cytology - PAP( [place])  Result  Value Ref Range   High risk HPV Negative    Adequacy      Satisfactory for evaluation; transformation zone component PRESENT.   Diagnosis (A)     - Atypical squamous cells of undetermined significance (ASC-US)   Comment Normal Reference Range HPV - Negative       Assessment & Plan:   Problem List Items Addressed This Visit   None    Follow up plan: No follow-ups on file.

## 2022-05-29 ENCOUNTER — Encounter: Payer: Self-pay | Admitting: Nurse Practitioner

## 2022-05-29 ENCOUNTER — Other Ambulatory Visit: Payer: Self-pay

## 2022-05-29 ENCOUNTER — Ambulatory Visit (INDEPENDENT_AMBULATORY_CARE_PROVIDER_SITE_OTHER): Payer: Self-pay | Admitting: Nurse Practitioner

## 2022-05-29 VITALS — BP 118/72 | HR 94 | Temp 97.7°F | Resp 16 | Ht 67.0 in | Wt 310.7 lb

## 2022-05-29 DIAGNOSIS — Z114 Encounter for screening for human immunodeficiency virus [HIV]: Secondary | ICD-10-CM

## 2022-05-29 DIAGNOSIS — E66813 Obesity, class 3: Secondary | ICD-10-CM

## 2022-05-29 DIAGNOSIS — Z131 Encounter for screening for diabetes mellitus: Secondary | ICD-10-CM | POA: Diagnosis not present

## 2022-05-29 DIAGNOSIS — F419 Anxiety disorder, unspecified: Secondary | ICD-10-CM | POA: Diagnosis not present

## 2022-05-29 DIAGNOSIS — E782 Mixed hyperlipidemia: Secondary | ICD-10-CM | POA: Diagnosis not present

## 2022-05-29 DIAGNOSIS — Z1159 Encounter for screening for other viral diseases: Secondary | ICD-10-CM | POA: Diagnosis not present

## 2022-05-29 DIAGNOSIS — Z130 Encounter for screening for diseases of the blood and blood-forming organs and certain disorders involving the immune mechanism: Secondary | ICD-10-CM

## 2022-05-29 DIAGNOSIS — F331 Major depressive disorder, recurrent, moderate: Secondary | ICD-10-CM

## 2022-05-29 DIAGNOSIS — K219 Gastro-esophageal reflux disease without esophagitis: Secondary | ICD-10-CM

## 2022-05-29 DIAGNOSIS — Z6841 Body Mass Index (BMI) 40.0 and over, adult: Secondary | ICD-10-CM

## 2022-05-29 DIAGNOSIS — Z7689 Persons encountering health services in other specified circumstances: Secondary | ICD-10-CM

## 2022-05-29 HISTORY — DX: Mixed hyperlipidemia: E78.2

## 2022-05-29 MED ORDER — BUPROPION HCL ER (XL) 150 MG PO TB24: 150.0000 mg | ORAL_TABLET | Freq: Every day | ORAL | 0 refills | Status: DC

## 2022-05-29 MED ORDER — SUCRALFATE 1 G PO TABS: 1.0000 g | ORAL_TABLET | Freq: Three times a day (TID) | ORAL | 0 refills | Status: DC

## 2022-05-29 MED ORDER — ZEPBOUND 2.5 MG/0.5ML ~~LOC~~ SOAJ: 2.5000 mg | SUBCUTANEOUS | 0 refills | Status: DC

## 2022-05-29 NOTE — Assessment & Plan Note (Signed)
last LDL was 117 on 05/15/2021.  Labs today.

## 2022-05-29 NOTE — Assessment & Plan Note (Signed)
Wellbutrin 150 mg daily.  Follow-up in 4 weeks.

## 2022-05-29 NOTE — Assessment & Plan Note (Signed)
Start zepbound.

## 2022-05-29 NOTE — Assessment & Plan Note (Signed)
Continue Carafate refill sent in.

## 2022-05-30 LAB — COMPLETE METABOLIC PANEL WITH GFR
AG Ratio: 1.7 (calc) (ref 1.0–2.5)
ALT: 10 U/L (ref 6–29)
AST: 16 U/L (ref 10–30)
Albumin: 4.3 g/dL (ref 3.6–5.1)
Alkaline phosphatase (APISO): 64 U/L (ref 31–125)
BUN: 12 mg/dL (ref 7–25)
CO2: 23 mmol/L (ref 20–32)
Calcium: 8.9 mg/dL (ref 8.6–10.2)
Chloride: 106 mmol/L (ref 98–110)
Creat: 0.84 mg/dL (ref 0.50–0.99)
Globulin: 2.5 g/dL (calc) (ref 1.9–3.7)
Glucose, Bld: 72 mg/dL (ref 65–99)
Potassium: 4.6 mmol/L (ref 3.5–5.3)
Sodium: 139 mmol/L (ref 135–146)
Total Bilirubin: 0.6 mg/dL (ref 0.2–1.2)
Total Protein: 6.8 g/dL (ref 6.1–8.1)
eGFR: 89 mL/min/{1.73_m2} (ref >=60)

## 2022-05-30 LAB — CBC WITH DIFFERENTIAL/PLATELET
Absolute Monocytes: 329 cells/uL (ref 200–950)
Basophils Absolute: 28 cells/uL (ref 0–200)
Basophils Relative: 0.6 %
Eosinophils Absolute: 240 cells/uL (ref 15–500)
Eosinophils Relative: 5.1 %
HCT: 41.8 % (ref 35.0–45.0)
Hemoglobin: 13.9 g/dL (ref 11.7–15.5)
Lymphs Abs: 1128 cells/uL (ref 850–3900)
MCH: 30.2 pg (ref 27.0–33.0)
MCHC: 33.3 g/dL (ref 32.0–36.0)
MCV: 90.9 fL (ref 80.0–100.0)
MPV: 12.1 fL (ref 7.5–12.5)
Monocytes Relative: 7 %
Neutro Abs: 2975 cells/uL (ref 1500–7800)
Neutrophils Relative %: 63.3 %
Platelets: 255 10*3/uL (ref 140–400)
RBC: 4.6 10*6/uL (ref 3.80–5.10)
RDW: 13.1 % (ref 11.0–15.0)
Total Lymphocyte: 24 %
WBC: 4.7 10*3/uL (ref 3.8–10.8)

## 2022-05-30 LAB — LIPID PANEL
Cholesterol: 205 mg/dL — ABNORMAL HIGH (ref <200)
HDL: 56 mg/dL (ref >=50)
LDL Cholesterol (Calc): 132 mg/dL (calc) — ABNORMAL HIGH
Non-HDL Cholesterol (Calc): 149 mg/dL (calc) — ABNORMAL HIGH (ref <130)
Total CHOL/HDL Ratio: 3.7 (calc) (ref <5.0)
Triglycerides: 80 mg/dL (ref <150)

## 2022-05-30 LAB — HEPATITIS C ANTIBODY: Hepatitis C Ab: NONREACTIVE

## 2022-05-30 LAB — TSH: TSH: 1.45 mIU/L

## 2022-05-30 LAB — HEMOGLOBIN A1C
Hgb A1c MFr Bld: 5.1 % of total Hgb (ref <5.7)
Mean Plasma Glucose: 100 mg/dL
eAG (mmol/L): 5.5 mmol/L

## 2022-05-30 LAB — HIV ANTIBODY (ROUTINE TESTING W REFLEX): HIV 1&2 Ab, 4th Generation: NONREACTIVE

## 2022-06-19 ENCOUNTER — Encounter: Payer: Self-pay | Admitting: Gastroenterology

## 2022-06-20 ENCOUNTER — Telehealth: Payer: Self-pay

## 2022-06-20 NOTE — Telephone Encounter (Signed)
Patient left a voicemail stating she had a bad upper respiratory infection and needing Reschedule her EGD. Reschedule to 07/16/2022. Called trish and she got patient moved to Dr. Allegra Lai

## 2022-06-25 ENCOUNTER — Other Ambulatory Visit: Payer: Self-pay | Admitting: Nurse Practitioner

## 2022-06-25 DIAGNOSIS — F331 Major depressive disorder, recurrent, moderate: Secondary | ICD-10-CM

## 2022-06-25 DIAGNOSIS — F419 Anxiety disorder, unspecified: Secondary | ICD-10-CM

## 2022-06-26 NOTE — Telephone Encounter (Signed)
Requested Prescriptions  Pending Prescriptions Disp Refills   buPROPion (WELLBUTRIN XL) 150 MG 24 hr tablet [Pharmacy Med Name: BUPROPION HCL XL 150 MG TABLET] 90 tablet 0    Sig: TAKE 1 TABLET BY MOUTH EVERY DAY     Psychiatry: Antidepressants - bupropion Passed - 06/25/2022  1:42 AM      Passed - Cr in normal range and within 360 days    Creat  Date Value Ref Range Status  05/29/2022 0.84 0.50 - 0.99 mg/dL Final         Passed - AST in normal range and within 360 days    AST  Date Value Ref Range Status  05/29/2022 16 10 - 30 U/L Final         Passed - ALT in normal range and within 360 days    ALT  Date Value Ref Range Status  05/29/2022 10 6 - 29 U/L Final         Passed - Completed PHQ-2 or PHQ-9 in the last 360 days      Passed - Last BP in normal range    BP Readings from Last 1 Encounters:  05/29/22 118/72         Passed - Valid encounter within last 6 months    Recent Outpatient Visits           4 weeks ago Class 3 severe obesity due to excess calories with serious comorbidity and body mass index (BMI) of 45.0 to 49.9 in adult [facility])   [facility], Rudolpho Sevin, FNP       Future Appointments             Tomorrow Zane Herald, Rudolpho Sevin, FNP [facility], [facility]

## 2022-06-26 NOTE — Progress Notes (Unsigned)
There were no vitals taken for this visit.   Subjective:    Patient ID: Lauren Kidd, female    DOB: Mar 02, 1980, 42 y.o.   MRN: [id number]  HPI: Lauren Kidd is a 42 y.o. female  No chief complaint on file.  Establish care: her last physical was last year.  Medical history includes listed below.  Family history includes depression, anxiety, GERD.  Health maintenance labs.   Obesity: her weight today is 310 lbs with a BMI of 48.66. her highest weight is current. She says she has tried lifestyle modification and phentermine.  She would like to try zepbound. Discussed side effects and administration.   Acid reflux: she currently takes carafate.  She says it helps her a lot.  She says she needs a refill. Will send in refill.   Anxiety/depression:  patient PHQ9 and GAD scores are positive. Patient reports her parents are on wellbutrin and that has really helped them and she would like to try that.  Will start wellbutrin and have patient follow up in 4 weeks.     05/29/2022    3:20 PM 05/15/2021    1:14 PM  Depression screen PHQ 2/9  Decreased Interest 1 0  Down, Depressed, Hopeless 2 1  PHQ - 2 Score 3 1  Altered sleeping 2 0  Tired, decreased energy 2 1  Change in appetite 2 1  Feeling bad or failure about yourself  2 0  Trouble concentrating 1 0  Moving slowly or fidgety/restless 0 0  Suicidal thoughts 0 0  PHQ-9 Score 12 3  Difficult doing work/chores Somewhat difficult Somewhat difficult       05/29/2022    3:20 PM  GAD 7 : Generalized Anxiety Score  Nervous, Anxious, on Edge 1  Control/stop worrying 1  Worry too much - different things 1  Trouble relaxing 1  Restless 0  Easily annoyed or irritable 0  Afraid - awful might happen 0  Total GAD 7 Score 4  Anxiety Difficulty Not difficult at all     Hyperlipidemia: her last LDL was 117 on 05/15/2021. She is not currently on medication.  Will get labs.   Relevant past medical, surgical, family and social history reviewed  and updated as indicated. Interim medical history since our last visit reviewed. Allergies and medications reviewed and updated.  Review of Systems  Constitutional: Negative for fever or weight change.  Respiratory: Negative for cough and shortness of breath.   Cardiovascular: Negative for chest pain or palpitations.  Gastrointestinal: Negative for abdominal pain, no bowel changes.  Musculoskeletal: Negative for gait problem or joint swelling.  Skin: Negative for rash.  Neurological: Negative for dizziness or headache.  No other specific complaints in a complete review of systems (except as listed in HPI above).      Objective:    There were no vitals taken for this visit.  Wt Readings from Last 3 Encounters:  05/29/22 (!) 310 lb 11.2 oz (140.9 kg)  05/23/22 (!) 312 lb 4 oz (141.6 kg)  05/02/22 (!) 307 lb (139.3 kg)    Physical Exam  Constitutional: Patient appears well-developed and well-nourished. Obese  No distress.  HEENT: head atraumatic, normocephalic, pupils equal and reactive to light, neck supple Cardiovascular: Normal rate, regular rhythm and normal heart sounds.  No murmur heard. No BLE edema. Pulmonary/Chest: Effort normal and breath sounds normal. No respiratory distress. Abdominal: Soft.  There is no tenderness. Psychiatric: Patient has a normal mood and affect. behavior is normal. Judgment  and thought content normal.   Results for orders placed or performed in visit on 05/29/22  CBC with Differential/Platelet  Result Value Ref Range   WBC 4.7 3.8 - 10.8 Thousand/uL   RBC 4.60 3.80 - 5.10 Million/uL   Hemoglobin 13.9 11.7 - 15.5 g/dL   HCT 16.1 09.6 - 04.5 %   MCV 90.9 80.0 - 100.0 fL   MCH 30.2 27.0 - 33.0 pg   MCHC 33.3 32.0 - 36.0 g/dL   RDW 40.9 81.1 - 91.4 %   Platelets 255 140 - 400 Thousand/uL   MPV 12.1 7.5 - 12.5 fL   Neutro Abs 2,975 1,500 - 7,800 cells/uL   Lymphs Abs 1,128.0 850 - 3,900 cells/uL   Absolute Monocytes 329 200 - 950 cells/uL    Eosinophils Absolute 240 15 - 500 cells/uL   Basophils Absolute 28 0 - 200 cells/uL   Neutrophils Relative % 63.3 %   Total Lymphocyte 24.0 %   Monocytes Relative 7.0 %   Eosinophils Relative 5.1 %   Basophils Relative 0.6 %  COMPLETE METABOLIC PANEL WITH GFR  Result Value Ref Range   Glucose, Bld 72 65 - 99 mg/dL   BUN 12 7 - 25 mg/dL   Creat 7.82 9.56 - 2.13 mg/dL   eGFR 89 > OR = 60 YQ/MVH/8.46N6   BUN/Creatinine Ratio SEE NOTE: 6 - 22 (calc)   Sodium 139 135 - 146 mmol/L   Potassium 4.6 3.5 - 5.3 mmol/L   Chloride 106 98 - 110 mmol/L   CO2 23 20 - 32 mmol/L   Calcium 8.9 8.6 - 10.2 mg/dL   Total Protein 6.8 6.1 - 8.1 g/dL   Albumin 4.3 3.6 - 5.1 g/dL   Globulin 2.5 1.9 - 3.7 g/dL (calc)   AG Ratio 1.7 1.0 - 2.5 (calc)   Total Bilirubin 0.6 0.2 - 1.2 mg/dL   Alkaline phosphatase (APISO) 64 31 - 125 U/L   AST 16 10 - 30 U/L   ALT 10 6 - 29 U/L  Lipid panel  Result Value Ref Range   Cholesterol 205 (H) <200 mg/dL   HDL 56 > OR = 50 mg/dL   Triglycerides 80 <295 mg/dL   LDL Cholesterol (Calc) 132 (H) mg/dL (calc)   Total CHOL/HDL Ratio 3.7 <5.0 (calc)   Non-HDL Cholesterol (Calc) 149 (H) <130 mg/dL (calc)  Hemoglobin M8U  Result Value Ref Range   Hgb A1c MFr Bld 5.1 <5.7 % of total Hgb   Mean Plasma Glucose 100 mg/dL   eAG (mmol/L) 5.5 mmol/L  Hepatitis C antibody  Result Value Ref Range   Hepatitis C Ab NON-REACTIVE NON-REACTIVE  HIV Antibody (routine testing w rflx)  Result Value Ref Range   HIV 1&2 Ab, 4th Generation NON-REACTIVE NON-REACTIVE  TSH  Result Value Ref Range   TSH 1.45 mIU/L      Assessment & Plan:   Problem List Items Addressed This Visit   None    Follow up plan: No follow-ups on file.

## 2022-06-27 ENCOUNTER — Ambulatory Visit: Payer: 59 | Admitting: Nurse Practitioner

## 2022-06-27 ENCOUNTER — Encounter: Payer: Self-pay | Admitting: Nurse Practitioner

## 2022-06-27 ENCOUNTER — Other Ambulatory Visit: Payer: Self-pay

## 2022-06-27 VITALS — BP 124/72 | HR 98 | Temp 97.6°F | Resp 18 | Ht 67.0 in | Wt 297.1 lb

## 2022-06-27 DIAGNOSIS — F331 Major depressive disorder, recurrent, moderate: Secondary | ICD-10-CM

## 2022-06-27 DIAGNOSIS — F419 Anxiety disorder, unspecified: Secondary | ICD-10-CM

## 2022-06-27 DIAGNOSIS — Z6841 Body Mass Index (BMI) 40.0 and over, adult: Secondary | ICD-10-CM

## 2022-06-27 MED ORDER — ZEPBOUND 7.5 MG/0.5ML ~~LOC~~ SOAJ: 7.5000 mg | SUBCUTANEOUS | 0 refills | Status: DC

## 2022-06-27 MED ORDER — ZEPBOUND 5 MG/0.5ML ~~LOC~~ SOAJ: 5.0000 mg | SUBCUTANEOUS | 0 refills | Status: DC

## 2022-06-27 NOTE — Assessment & Plan Note (Signed)
Continue taking Wellbutrin 150 mg daily.

## 2022-06-27 NOTE — Assessment & Plan Note (Signed)
Taking weekly.  Continue working on lifestyle modification.  Next 2 doses sent in.

## 2022-06-28 ENCOUNTER — Encounter: Payer: Self-pay | Admitting: Nurse Practitioner

## 2022-06-28 ENCOUNTER — Other Ambulatory Visit: Payer: Self-pay | Admitting: Nurse Practitioner

## 2022-06-28 DIAGNOSIS — E66813 Obesity, class 3: Secondary | ICD-10-CM

## 2022-06-28 MED ORDER — ZEPBOUND 2.5 MG/0.5ML ~~LOC~~ SOAJ: 2.5000 mg | SUBCUTANEOUS | 0 refills | Status: DC

## 2022-07-03 ENCOUNTER — Ambulatory Visit: Payer: Self-pay | Admitting: Nurse Practitioner

## 2022-07-13 ENCOUNTER — Encounter: Payer: Self-pay | Admitting: Gastroenterology

## 2022-07-16 ENCOUNTER — Encounter: Admission: RE | Payer: Self-pay | Source: Home / Self Care

## 2022-07-16 ENCOUNTER — Ambulatory Visit: Admission: RE | Admit: 2022-07-16 | Payer: 59 | Source: Home / Self Care | Admitting: Gastroenterology

## 2022-07-16 SURGERY — ESOPHAGOGASTRODUODENOSCOPY (EGD) WITH PROPOFOL
Anesthesia: General

## 2022-08-07 ENCOUNTER — Other Ambulatory Visit: Payer: Self-pay | Admitting: Nurse Practitioner

## 2022-08-08 NOTE — Telephone Encounter (Signed)
Requested medication (s) are due for refill today - no  Requested medication (s) are on the active medication list -yes  Future visit scheduled -yes  Last refill: 07/28/22 2ml  Notes to clinic: off protocol- provider review   Requested Prescriptions  Pending Prescriptions Disp Refills   ZEPBOUND 5 MG/0.5ML Pen [Pharmacy Med Name: ZEPBOUND 5 MG/0.5 ML PEN] 2 mL 0    Sig: INJECT 5 MG UNDER THE SKIN ONCE WEEKLY     Off-Protocol Failed - 08/07/2022  4:13 PM      Failed - Medication not assigned to a protocol, review manually.      Passed - Valid encounter within last 12 months    Recent Outpatient Visits           1 month ago Moderate episode of recurrent major depressive disorder [facility])   [place] [facility] Della Goo F, FNP   2 months ago Class 3 severe obesity due to excess calories with serious comorbidity and body mass index (BMI) of 45.0 to 49.9 in adult [facility])   [facility] Berniece Salines, FNP       Future Appointments             In 1 month Zane Herald, Rudolpho Sevin, FNP [facility], [facility]               Requested Prescriptions  Pending Prescriptions Disp Refills   ZEPBOUND 5 MG/0.5ML Pen [Pharmacy Med Name: ZEPBOUND 5 MG/0.5 ML PEN] 2 mL 0    Sig: INJECT 5 MG UNDER THE SKIN ONCE WEEKLY     Off-Protocol Failed - 08/07/2022  4:13 PM      Failed - Medication not assigned to a protocol, review manually.      Passed - Valid encounter within last 12 months    Recent Outpatient Visits           1 month ago Moderate episode of recurrent major depressive disorder [facility])   [place] [facility] Della Goo F, FNP   2 months ago Class 3 severe obesity due to excess calories with serious comorbidity and body mass index (BMI) of 45.0 to 49.9 in adult [facility])   [facility] Berniece Salines, FNP       Future Appointments             In 1 month Zane Herald, Rudolpho Sevin,  FNP [facility]

## 2022-08-16 ENCOUNTER — Other Ambulatory Visit: Payer: Self-pay | Admitting: Family Medicine

## 2022-09-19 ENCOUNTER — Other Ambulatory Visit: Payer: Self-pay | Admitting: Nurse Practitioner

## 2022-09-19 MED ORDER — ZEPBOUND 7.5 MG/0.5ML ~~LOC~~ SOAJ: 7.5000 mg | SUBCUTANEOUS | 0 refills | Status: DC

## 2022-10-02 ENCOUNTER — Ambulatory Visit: Payer: 59 | Admitting: Nurse Practitioner

## 2022-10-09 ENCOUNTER — Ambulatory Visit: Payer: 59 | Admitting: Nurse Practitioner

## 2022-10-09 ENCOUNTER — Encounter: Payer: Self-pay | Admitting: Nurse Practitioner

## 2022-10-09 VITALS — BP 128/72 | HR 104 | Temp 98.0°F | Resp 16 | Ht 67.0 in | Wt 276.2 lb

## 2022-10-09 DIAGNOSIS — E782 Mixed hyperlipidemia: Secondary | ICD-10-CM | POA: Diagnosis not present

## 2022-10-09 DIAGNOSIS — Z6841 Body Mass Index (BMI) 40.0 and over, adult: Secondary | ICD-10-CM | POA: Diagnosis not present

## 2022-10-09 DIAGNOSIS — F419 Anxiety disorder, unspecified: Secondary | ICD-10-CM | POA: Diagnosis not present

## 2022-10-09 DIAGNOSIS — F331 Major depressive disorder, recurrent, moderate: Secondary | ICD-10-CM | POA: Diagnosis not present

## 2022-10-09 DIAGNOSIS — K219 Gastro-esophageal reflux disease without esophagitis: Secondary | ICD-10-CM | POA: Diagnosis not present

## 2022-10-09 DIAGNOSIS — R112 Nausea with vomiting, unspecified: Secondary | ICD-10-CM | POA: Diagnosis not present

## 2022-10-09 MED ORDER — SUCRALFATE 1 G PO TABS: 1.0000 g | ORAL_TABLET | Freq: Three times a day (TID) | ORAL | 1 refills | Status: DC

## 2022-10-09 MED ORDER — BUPROPION HCL ER (XL) 150 MG PO TB24: 150.0000 mg | ORAL_TABLET | Freq: Every day | ORAL | 1 refills | Status: AC

## 2022-10-09 MED ORDER — ZEPBOUND 10 MG/0.5ML ~~LOC~~ SOAJ: 10.0000 mg | SUBCUTANEOUS | 0 refills | Status: DC

## 2022-10-09 NOTE — Assessment & Plan Note (Signed)
Continue wellbutrin 150 mg daily

## 2022-10-09 NOTE — Progress Notes (Signed)
BP 128/72   Pulse (!) 104   Temp 98 F (36.7 C) (Oral)   Resp 16   Ht 5\' 7"  (1.702 m)   Wt 276 lb 3.2 oz (125.3 kg)   SpO2 98%   BMI 43.26 kg/m    Subjective:    Patient ID: Lauren Kidd, female    DOB: 1980-06-10, 42 y.o.   MRN: [id number]  HPI: Lauren Kidd is a 42 y.o. female  Chief Complaint  Patient presents with   Follow-up   Depression   Anxiety   Hyperlipidemia   Obesity:  Current weight : 276 lbs BMI: 43.26 Previous weight:297 lbs Treatment Tried: lifestyle modification, phentermine, currently on zepbound Comorbidities: acid reflux, anxiety, depression, hld   Depression/anxiety Medication wellbutrin 150 mg daily Compliant yes Side effects none PHQ9 negative GAD negative     10/09/2022    7:43 AM 06/27/2022    9:06 AM 05/29/2022    3:20 PM 05/15/2021    1:14 PM  Depression screen PHQ 2/9  Decreased Interest 0 1 1 0  Down, Depressed, Hopeless 0 1 2 1   PHQ - 2 Score 0 2 3 1   Altered sleeping 0 0 2 0  Tired, decreased energy 0 1 2 1   Change in appetite 0 0 2 1  Feeling bad or failure about yourself  0 0 2 0  Trouble concentrating 0 0 1 0  Moving slowly or fidgety/restless 0 0 0 0  Suicidal thoughts 0 0 0 0  PHQ-9 Score 0 3 12 3   Difficult doing work/chores Not difficult at all Not difficult at all Somewhat difficult Somewhat difficult       10/09/2022    7:43 AM 06/27/2022    9:06 AM 05/29/2022    3:20 PM  GAD 7 : Generalized Anxiety Score  Nervous, Anxious, on Edge 0 0 1  Control/stop worrying 0 0 1  Worry too much - different things 0 0 1  Trouble relaxing 0 0 1  Restless 0 0 0  Easily annoyed or irritable 0 0 0  Afraid - awful might happen 0 0 0  Total GAD 7 Score 0 0 4  Anxiety Difficulty Not difficult at all Not difficult at all Not difficult at all    HLD:  -Medications: none -Patient is working on lifestyle modification -Last lipid panel:  Lipid Panel     Component Value Date/Time   CHOL 205 (H) 05/29/2022 1538   TRIG 80  05/29/2022 1538   HDL 56 05/29/2022 1538   CHOLHDL 3.7 05/29/2022 1538   VLDL 15.4 05/15/2021 1421   LDLCALC 132 (H) 05/29/2022 1538    GERD GERD control status: controlledSatisfied with current treatment? yes Heartburn frequency:  Medication side effects: no  Medication compliance: stable GERD medications: carafate Antacid use frequency:  1-2 a week Alleviatiating factors:  tums,carafate Aggravating factors: foods that are acidic Dysphagia: no Odynophagia:  no Hematemesis: no Blood in stool: no EGD: no   Relevant past medical, surgical, family and social history reviewed and updated as indicated. Interim medical history since our last visit reviewed. Allergies and medications reviewed and updated.  Review of Systems  Constitutional: Negative for fever or weight change.  Respiratory: Negative for cough and shortness of breath.   Cardiovascular: Negative for chest pain or palpitations.  Gastrointestinal: Negative for abdominal pain, no bowel changes.  Musculoskeletal: Negative for gait problem or joint swelling.  Skin: Negative for rash.  Neurological: Negative for dizziness or headache.  No other specific complaints  in a complete review of systems (except as listed in HPI above).      Objective:    BP 128/72   Pulse (!) 104   Temp 98 F (36.7 C) (Oral)   Resp 16   Ht 5\' 7"  (1.702 m)   Wt 276 lb 3.2 oz (125.3 kg)   SpO2 98%   BMI 43.26 kg/m   Wt Readings from Last 3 Encounters:  10/09/22 276 lb 3.2 oz (125.3 kg)  06/27/22 297 lb 1.6 oz (134.8 kg)  05/29/22 (!) 310 lb 11.2 oz (140.9 kg)    Physical Exam  Constitutional: Patient appears well-developed and well-nourished. Obese  No distress.  HEENT: head atraumatic, normocephalic, pupils equal and reactive to light, neck supple Cardiovascular: Normal rate, regular rhythm and normal heart sounds.  No murmur heard. No BLE edema. Pulmonary/Chest: Effort normal and breath sounds normal. No respiratory  distress. Abdominal: Soft.  There is no tenderness. Psychiatric: Patient has a normal mood and affect. behavior is normal. Judgment and thought content normal.   Results for orders placed or performed in visit on 05/29/22  CBC with Differential/Platelet  Result Value Ref Range   WBC 4.7 3.8 - 10.8 Thousand/uL   RBC 4.60 3.80 - 5.10 Million/uL   Hemoglobin 13.9 11.7 - 15.5 g/dL   HCT 41.3 24.4 - 01.0 %   MCV 90.9 80.0 - 100.0 fL   MCH 30.2 27.0 - 33.0 pg   MCHC 33.3 32.0 - 36.0 g/dL   RDW 27.2 53.6 - 64.4 %   Platelets 255 140 - 400 Thousand/uL   MPV 12.1 7.5 - 12.5 fL   Neutro Abs 2,975 1,500 - 7,800 cells/uL   Lymphs Abs 1,128.0 850 - 3,900 cells/uL   Absolute Monocytes 329 200 - 950 cells/uL   Eosinophils Absolute 240 15 - 500 cells/uL   Basophils Absolute 28 0 - 200 cells/uL   Neutrophils Relative % 63.3 %   Total Lymphocyte 24.0 %   Monocytes Relative 7.0 %   Eosinophils Relative 5.1 %   Basophils Relative 0.6 %  COMPLETE METABOLIC PANEL WITH GFR  Result Value Ref Range   Glucose, Bld 72 65 - 99 mg/dL   BUN 12 7 - 25 mg/dL   Creat 0.34 7.42 - 5.95 mg/dL   eGFR 89 > OR = 60 GL/OVF/6.43P2   BUN/Creatinine Ratio SEE NOTE: 6 - 22 (calc)   Sodium 139 135 - 146 mmol/L   Potassium 4.6 3.5 - 5.3 mmol/L   Chloride 106 98 - 110 mmol/L   CO2 23 20 - 32 mmol/L   Calcium 8.9 8.6 - 10.2 mg/dL   Total Protein 6.8 6.1 - 8.1 g/dL   Albumin 4.3 3.6 - 5.1 g/dL   Globulin 2.5 1.9 - 3.7 g/dL (calc)   AG Ratio 1.7 1.0 - 2.5 (calc)   Total Bilirubin 0.6 0.2 - 1.2 mg/dL   Alkaline phosphatase (APISO) 64 31 - 125 U/L   AST 16 10 - 30 U/L   ALT 10 6 - 29 U/L  Lipid panel  Result Value Ref Range   Cholesterol 205 (H) <200 mg/dL   HDL 56 > OR = 50 mg/dL   Triglycerides 80 <951 mg/dL   LDL Cholesterol (Calc) 132 (H) mg/dL (calc)   Total CHOL/HDL Ratio 3.7 <5.0 (calc)   Non-HDL Cholesterol (Calc) 149 (H) <130 mg/dL (calc)  Hemoglobin O8C  Result Value Ref Range   Hgb A1c MFr Bld 5.1  <5.7 % of total Hgb   Mean  Plasma Glucose 100 mg/dL   eAG (mmol/L) 5.5 mmol/L  Hepatitis C antibody  Result Value Ref Range   Hepatitis C Ab NON-REACTIVE NON-REACTIVE  HIV Antibody (routine testing w rflx)  Result Value Ref Range   HIV 1&2 Ab, 4th Generation NON-REACTIVE NON-REACTIVE  TSH  Result Value Ref Range   TSH 1.45 mIU/L      Assessment & Plan:   Problem List Items Addressed This Visit       Digestive   Acid reflux    Continue using carafate for acid reflux, avoid triggers      Relevant Medications   sucralfate (CARAFATE) 1 g tablet     Other   Obesity - Primary    Currently taking zepbound, doing well.  Continue to work on lifestyle modification.       Relevant Medications   tirzepatide (ZEPBOUND) 10 MG/0.5ML Pen   Moderate episode of recurrent major depressive disorder (HCC)    Continue wellbutrin 150 mg daily      Relevant Medications   buPROPion (WELLBUTRIN XL) 150 MG 24 hr tablet   Mixed hyperlipidemia    Continue working on lifestyle modification      Anxiety    Continue wellbutrin 150 mg daily      Relevant Medications   buPROPion (WELLBUTRIN XL) 150 MG 24 hr tablet      Follow up plan: Return in about 6 months (around 04/11/2023) for follow up.

## 2022-10-09 NOTE — Assessment & Plan Note (Signed)
Currently taking zepbound, doing well.  Continue to work on lifestyle modification.

## 2022-10-09 NOTE — Assessment & Plan Note (Signed)
Continue using carafate for acid reflux, avoid triggers

## 2022-10-09 NOTE — Assessment & Plan Note (Signed)
Continue working on lifestyle modification.  

## 2022-11-30 ENCOUNTER — Other Ambulatory Visit: Payer: Self-pay | Admitting: Nurse Practitioner

## 2022-11-30 ENCOUNTER — Encounter: Payer: Self-pay | Admitting: Nurse Practitioner

## 2022-11-30 MED ORDER — ZEPBOUND 10 MG/0.5ML ~~LOC~~ SOAJ: 10.0000 mg | SUBCUTANEOUS | 0 refills | Status: DC

## 2022-12-03 ENCOUNTER — Ambulatory Visit: Payer: 59 | Admitting: Gastroenterology

## 2022-12-04 ENCOUNTER — Other Ambulatory Visit: Payer: Self-pay

## 2022-12-04 DIAGNOSIS — K219 Gastro-esophageal reflux disease without esophagitis: Secondary | ICD-10-CM

## 2022-12-28 ENCOUNTER — Other Ambulatory Visit: Payer: Self-pay | Admitting: Nurse Practitioner

## 2022-12-28 ENCOUNTER — Encounter: Payer: Self-pay | Admitting: Nurse Practitioner

## 2022-12-28 DIAGNOSIS — E66813 Obesity, class 3: Secondary | ICD-10-CM

## 2022-12-28 MED ORDER — ZEPBOUND 5 MG/0.5ML ~~LOC~~ SOAJ: 5.0000 mg | SUBCUTANEOUS | 0 refills | Status: DC

## 2023-01-02 ENCOUNTER — Ambulatory Visit: Admission: RE | Admit: 2023-01-02 | Payer: 59 | Source: Home / Self Care | Admitting: Gastroenterology

## 2023-01-02 ENCOUNTER — Encounter: Payer: Self-pay | Admitting: Anesthesiology

## 2023-01-02 SURGERY — ESOPHAGOGASTRODUODENOSCOPY (EGD) WITH PROPOFOL
Anesthesia: General

## 2023-01-02 NOTE — Anesthesia Preprocedure Evaluation (Signed)
Anesthesia Evaluation    Airway Mallampati: III       Dental   Pulmonary           Cardiovascular      Neuro/Psych  PSYCHIATRIC DISORDERS Anxiety Depression       GI/Hepatic ,GERD  ,,  Endo/Other  Class 3 obesity  Renal/GU      Musculoskeletal   Abdominal   Peds  Hematology  (+) Blood dyscrasia, anemia   Anesthesia Other Findings   Reproductive/Obstetrics                             Anesthesia Physical Anesthesia Plan  ASA: 3  Anesthesia Plan: General   Post-op Pain Management:    Induction:   PONV Risk Score and Plan:   Airway Management Planned:   Additional Equipment:   Intra-op Plan:   Post-operative Plan:   Informed Consent:   Plan Discussed with:   Anesthesia Plan Comments:        Anesthesia Quick Evaluation

## 2023-04-11 ENCOUNTER — Ambulatory Visit: Payer: Self-pay | Admitting: Nurse Practitioner

## 2023-04-11 NOTE — Progress Notes (Deleted)
 There were no vitals taken for this visit.   Subjective:    Patient ID: Lauren Kidd, female    DOB: 06-02-1980, 43 y.o.   MRN: [id number]  HPI: Lauren Kidd is a 43 y.o. female  No chief complaint on file.   Discussed the use of AI scribe software for clinical note transcription with the patient, who gave verbal consent to proceed.  History of Present Illness           10/09/2022    7:43 AM 06/27/2022    9:06 AM 05/29/2022    3:20 PM  Depression screen PHQ 2/9  Decreased Interest 0 1 1  Down, Depressed, Hopeless 0 1 2  PHQ - 2 Score 0 2 3  Altered sleeping 0 0 2  Tired, decreased energy 0 1 2  Change in appetite 0 0 2  Feeling bad or failure about yourself  0 0 2  Trouble concentrating 0 0 1  Moving slowly or fidgety/restless 0 0 0  Suicidal thoughts 0 0 0  PHQ-9 Score 0 3 12  Difficult doing work/chores Not difficult at all Not difficult at all Somewhat difficult    Relevant past medical, surgical, family and social history reviewed and updated as indicated. Interim medical history since our last visit reviewed. Allergies and medications reviewed and updated.  Review of Systems  Constitutional: Negative for fever or weight change.  Respiratory: Negative for cough and shortness of breath.   Cardiovascular: Negative for chest pain or palpitations.  Gastrointestinal: Negative for abdominal pain, no bowel changes.  Musculoskeletal: Negative for gait problem or joint swelling.  Skin: Negative for rash.  Neurological: Negative for dizziness or headache.  No other specific complaints in a complete review of systems (except as listed in HPI above).      Objective:    There were no vitals taken for this visit.  {Vitals History (Optional):23777} Wt Readings from Last 3 Encounters:  10/09/22 276 lb 3.2 oz (125.3 kg)  06/27/22 297 lb 1.6 oz (134.8 kg)  05/29/22 (!) 310 lb 11.2 oz (140.9 kg)    Physical Exam Vitals reviewed.  Constitutional:      Appearance: Normal  appearance.  HENT:     Head: Normocephalic.  Cardiovascular:     Rate and Rhythm: Normal rate and regular rhythm.  Pulmonary:     Effort: Pulmonary effort is normal.     Breath sounds: Normal breath sounds.  Musculoskeletal:        General: Normal range of motion.  Skin:    General: Skin is warm and dry.  Neurological:     General: No focal deficit present.     Mental Status: She is alert and oriented to person, place, and time. Mental status is at baseline.  Psychiatric:        Mood and Affect: Mood normal.        Behavior: Behavior normal.        Thought Content: Thought content normal.        Judgment: Judgment normal.   Results for orders placed or performed in visit on 05/29/22  CBC with Differential/Platelet   Collection Time: 05/29/22  3:38 PM  Result Value Ref Range   WBC 4.7 3.8 - 10.8 Thousand/uL   RBC 4.60 3.80 - 5.10 Million/uL   Hemoglobin 13.9 11.7 - 15.5 g/dL   HCT 78.2 95.6 - 21.3 %   MCV 90.9 80.0 - 100.0 fL   MCH 30.2 27.0 - 33.0 pg   MCHC  33.3 32.0 - 36.0 g/dL   RDW 16.1 09.6 - 04.5 %   Platelets 255 140 - 400 Thousand/uL   MPV 12.1 7.5 - 12.5 fL   Neutro Abs 2,975 1,500 - 7,800 cells/uL   Lymphs Abs 1,128.0 850 - 3,900 cells/uL   Absolute Monocytes 329 200 - 950 cells/uL   Eosinophils Absolute 240 15 - 500 cells/uL   Basophils Absolute 28 0 - 200 cells/uL   Neutrophils Relative % 63.3 %   Total Lymphocyte 24.0 %   Monocytes Relative 7.0 %   Eosinophils Relative 5.1 %   Basophils Relative 0.6 %  COMPLETE METABOLIC PANEL WITH GFR   Collection Time: 05/29/22  3:38 PM  Result Value Ref Range   Glucose, Bld 72 65 - 99 mg/dL   BUN 12 7 - 25 mg/dL   Creat 4.09 8.11 - 9.14 mg/dL   eGFR 89 > OR = 60 NW/GNF/6.21H0   BUN/Creatinine Ratio SEE NOTE: 6 - 22 (calc)   Sodium 139 135 - 146 mmol/L   Potassium 4.6 3.5 - 5.3 mmol/L   Chloride 106 98 - 110 mmol/L   CO2 23 20 - 32 mmol/L   Calcium 8.9 8.6 - 10.2 mg/dL   Total Protein 6.8 6.1 - 8.1 g/dL    Albumin 4.3 3.6 - 5.1 g/dL   Globulin 2.5 1.9 - 3.7 g/dL (calc)   AG Ratio 1.7 1.0 - 2.5 (calc)   Total Bilirubin 0.6 0.2 - 1.2 mg/dL   Alkaline phosphatase (APISO) 64 31 - 125 U/L   AST 16 10 - 30 U/L   ALT 10 6 - 29 U/L  Lipid panel   Collection Time: 05/29/22  3:38 PM  Result Value Ref Range   Cholesterol 205 (H) <200 mg/dL   HDL 56 > OR = 50 mg/dL   Triglycerides 80 <865 mg/dL   LDL Cholesterol (Calc) 132 (H) mg/dL (calc)   Total CHOL/HDL Ratio 3.7 <5.0 (calc)   Non-HDL Cholesterol (Calc) 149 (H) <130 mg/dL (calc)  Hemoglobin H8I   Collection Time: 05/29/22  3:38 PM  Result Value Ref Range   Hgb A1c MFr Bld 5.1 <5.7 % of total Hgb   Mean Plasma Glucose 100 mg/dL   eAG (mmol/L) 5.5 mmol/L  Hepatitis C antibody   Collection Time: 05/29/22  3:38 PM  Result Value Ref Range   Hepatitis C Ab NON-REACTIVE NON-REACTIVE  HIV Antibody (routine testing w rflx)   Collection Time: 05/29/22  3:38 PM  Result Value Ref Range   HIV 1&2 Ab, 4th Generation NON-REACTIVE NON-REACTIVE  TSH   Collection Time: 05/29/22  3:38 PM  Result Value Ref Range   TSH 1.45 mIU/L   {Labs (Optional):23779}    Assessment & Plan:   Problem List Items Addressed This Visit   None    Assessment and Plan             Follow up plan: No follow-ups on file.

## 2024-03-18 ENCOUNTER — Ambulatory Visit: Payer: Self-pay | Admitting: Nurse Practitioner

## 2024-03-30 ENCOUNTER — Ambulatory Visit: Payer: Self-pay | Admitting: Nurse Practitioner

## 2024-04-02 ENCOUNTER — Ambulatory Visit: Payer: Self-pay | Admitting: Nurse Practitioner

## 2024-04-02 ENCOUNTER — Encounter: Payer: Self-pay | Admitting: Nurse Practitioner

## 2024-04-02 VITALS — BP 112/84 | HR 80 | Temp 97.7°F | Ht 67.0 in | Wt 287.0 lb

## 2024-04-02 DIAGNOSIS — E66813 Obesity, class 3: Secondary | ICD-10-CM

## 2024-04-02 DIAGNOSIS — E782 Mixed hyperlipidemia: Secondary | ICD-10-CM

## 2024-04-02 DIAGNOSIS — Z131 Encounter for screening for diabetes mellitus: Secondary | ICD-10-CM

## 2024-04-02 DIAGNOSIS — Z0001 Encounter for general adult medical examination with abnormal findings: Secondary | ICD-10-CM

## 2024-04-02 DIAGNOSIS — Z130 Encounter for screening for diseases of the blood and blood-forming organs and certain disorders involving the immune mechanism: Secondary | ICD-10-CM

## 2024-04-02 DIAGNOSIS — Z6841 Body Mass Index (BMI) 40.0 and over, adult: Secondary | ICD-10-CM

## 2024-04-02 DIAGNOSIS — Z0000 Encounter for general adult medical examination without abnormal findings: Secondary | ICD-10-CM

## 2024-04-02 DIAGNOSIS — Z23 Encounter for immunization: Secondary | ICD-10-CM

## 2024-04-02 DIAGNOSIS — Z1231 Encounter for screening mammogram for malignant neoplasm of breast: Secondary | ICD-10-CM

## 2024-04-02 DIAGNOSIS — K219 Gastro-esophageal reflux disease without esophagitis: Secondary | ICD-10-CM

## 2024-04-02 MED ORDER — SUCRALFATE 1 G PO TABS: 1.0000 g | ORAL_TABLET | Freq: Three times a day (TID) | ORAL | 1 refills | Status: AC

## 2024-04-02 NOTE — Progress Notes (Signed)
 Name: Lauren Kidd   MRN: [id number]    DOB: 1981/01/13   Date:04/02/2024       Progress Note  Subjective  Chief Complaint  Chief Complaint  Patient presents with   Annual Exam    Pt c/o left side abdominal and chest pain x2 weeks.    Medication Refill    Would like to discuss restarting zepbound .     HPI  Patient presents for annual CPE. Discussed the use of AI scribe software for clinical note transcription with the patient, who gave verbal consent to proceed.  History of Present Illness Lauren Kidd is a 44 year old female who presents for an annual physical exam.  Chest and abdominal pain - Intermittent pain for the past couple of weeks, located in the chest and side, radiating to the shoulder blade and through the stomach area - Pain intensity varies and is more noticeable when eating or bending over - Shoulder blade soreness occurs with pressure or when lying on it - No shortness of breath or palpitations - Associated with some mucus production - No recent trauma or violence at home -no spinal tenderness or abdominal tenderness on exam  Gastrointestinal symptoms - History of gastroesophageal reflux disease (GERD) - Previously taking Carafate , one gram four times a day, for acid reflux; currently out of medication - Symptoms aggravated by high-fat foods; soup and potatoes do not worsen symptoms - Increased 'food stool' since onset of pain - No changes in bowel habits aside from increased 'food stool'  Obesity and weight management - Current weight is 287 pounds with a BMI of 44.95 - Previously on Zepbound  for weight management, discontinued due to busy work schedule - Interested in resuming Zepbound  if test results allow  Hyperlipidemia - Previous lipid panel showed LDL of 132  Alcohol use - History of alcohol use, increased during COVID pandemic but has since decreased  Gynecologic history - Sexually active, uses Mirena IUD - Last menstrual period occurred  about four to five weeks ago  Lifestyle and preventive care - Sleep duration between six and eight hours per night - Not currently engaging in regular physical exercise - Last dental exam five months ago - Last eye exam one year ago  Family history - Family history of hiatal hernias, unsure if symptomatic    Diet: well balanced diet Exercise: not currently, recommend 150 min of physical activity weekly    Sleep: 6-8 hours Last dental exam:5 months Last eye exam: 1 year ago  Flowsheet Row Appointment from 03/30/2024 in [facility]  AUDIT-C Score 3    Depression: Phq 9 is  negative    04/02/2024    7:54 AM 10/09/2022    7:43 AM 06/27/2022    9:06 AM 05/29/2022    3:20 PM 05/15/2021    1:14 PM  Depression screen PHQ 2/9  Decreased Interest 0 0 1 1 0  Down, Depressed, Hopeless 0 0 1 2 1   PHQ - 2 Score 0 0 2 3 1   Altered sleeping 0 0 0 2 0  Tired, decreased energy 0 0 1 2 1   Change in appetite 0 0 0 2 1  Feeling bad or failure about yourself  0 0 0 2 0  Trouble concentrating 0 0 0 1 0  Moving slowly or fidgety/restless 0 0 0 0 0  Suicidal thoughts 0 0 0 0 0  PHQ-9 Score 0 0  3  12  3    Difficult doing work/chores  Not  difficult at all Not difficult at all Somewhat difficult Somewhat difficult     Data saved with a previous flowsheet row definition   Hypertension: BP Readings from Last 3 Encounters:  04/02/24 112/84  10/09/22 128/72  06/27/22 124/72   Obesity: Wt Readings from Last 3 Encounters:  04/02/24 287 lb (130.2 kg)  10/09/22 276 lb 3.2 oz (125.3 kg)  06/27/22 297 lb 1.6 oz (134.8 kg)   BMI Readings from Last 3 Encounters:  04/02/24 44.95 kg/m  10/09/22 43.26 kg/m  06/27/22 46.53 kg/m     Vaccines:  HPV: up to at age 15 , ask insurance if age between 88-45  Shingrix: 57-64 yo and ask insurance if covered when patient above 55 yo Pneumonia:  educated and discussed with patient. Flu:  educated and discussed with patient.  Hep C  Screening: completed STD testing and prevention (HIV/chl/gon/syphilis): completed Intimate partner violence:none Sexual History : sexually active Menstrual History/LMP/Abnormal Bleeding: [facility]: IUD Incontinence Symptoms: none  Breast cancer:  - Last Mammogram: due, order placed - BRCA gene screening: none  Osteoporosis: Discussed high calcium and vitamin D supplementation, weight bearing exercises  Cervical cancer screening: 05/02/2022, ASCUS, recommend repeat will schedule with GYN  Skin cancer: Discussed monitoring for atypical lesions  Colorectal cancer: does not qualify   Lung cancer:   Low Dose CT Chest recommended if Age 3-80 years, 20 pack-year currently smoking OR have quit w/in 15years. Patient does not qualify.   ECG: 05/06/2021  Advanced Care Planning: A voluntary discussion about advance care planning including the explanation and discussion of advance directives.  Discussed health care proxy and Living will, and the patient was able to identify a health care proxy as Donnice husband.  Patient does not have a living will at present time. If patient does have living will, I have requested they bring this to the clinic to be scanned in to their chart.  Lipids: Lab Results  Component Value Date   CHOL 205 (H) 05/29/2022   CHOL 185 05/15/2021   Lab Results  Component Value Date   HDL 56 05/29/2022   HDL 52.40 05/15/2021   Lab Results  Component Value Date   LDLCALC 132 (H) 05/29/2022   LDLCALC 117 (H) 05/15/2021   Lab Results  Component Value Date   TRIG 80 05/29/2022   TRIG 77.0 05/15/2021   Lab Results  Component Value Date   CHOLHDL 3.7 05/29/2022   CHOLHDL 4 05/15/2021   No results found for: LDLDIRECT  Glucose: Glucose, Bld  Date Value Ref Range Status  05/29/2022 72 65 - 99 mg/dL Final    Comment:    .            Fasting reference interval .   05/15/2021 72 70 - 99 mg/dL Final    Patient Active Problem List   Diagnosis Date Noted   Moderate  episode of recurrent major depressive disorder (HCC) 05/29/2022   Mixed hyperlipidemia 05/29/2022   Anxiety 05/29/2022   RAD (reactive airway disease) with wheezing, mild intermittent, uncomplicated 08/10/2015   Obesity 03/28/2014   Acid reflux 03/29/2006    Past Surgical History:  Procedure Laterality Date   CHOLECYSTECTOMY  2009    Family History  Problem Relation Age of Onset   Depression Mother    Hypertension Maternal Grandmother    Depression Maternal Grandfather    Depression Other     Social History   Socioeconomic History   Marital status: Married    Spouse name: Not on file  Number of children: 1   Years of education: Not on file   Highest education level: Master's degree (e.g., MA, MS, MEng, MEd, MSW, MBA)  Occupational History   Not on file  Tobacco Use   Smoking status: Never    Passive exposure: Past   Smokeless tobacco: Never  Vaping Use   Vaping status: Never Used  Substance and Sexual Activity   Alcohol use: Not Currently    Alcohol/week: 10.0 standard drinks of alcohol    Types: 10 Standard drinks or equivalent per week    Comment: having digestion issues   Drug use: Never   Sexual activity: Yes    Partners: Male    Birth control/protection: I.U.D.    Comment: Mirena inserted 05/27/17, menarche 44yo, sexual debut 44yo  Other Topics Concern   Not on file  Social History Narrative   Not on file   Social Drivers of Health   Tobacco Use: Low Risk (04/02/2024)   Patient History    Smoking Tobacco Use: Never    Smokeless Tobacco Use: Never    Passive Exposure: Past  Financial Resource Strain: Low Risk (03/29/2024)   Overall Financial Resource Strain (CARDIA)    Difficulty of Paying Living Expenses: Not very hard  Food Insecurity: No Food Insecurity (03/29/2024)   Epic    Worried About Radiation Protection Practitioner of Food in the Last Year: Never true    Ran Out of Food in the Last Year: Never true  Transportation Needs: No Transportation Needs (03/29/2024)   Epic     Lack of Transportation (Medical): No    Lack of Transportation (Non-Medical): No  Physical Activity: Insufficiently Active (03/29/2024)   Exercise Vital Sign    Days of Exercise per Week: 2 days    Minutes of Exercise per Session: 10 min  Stress: Stress Concern Present (03/29/2024)   [employer] of Occupational Health - Occupational Stress Questionnaire    Feeling of Stress: To some extent  Social Connections: Socially Integrated (03/29/2024)   Social Connection and Isolation Panel    Frequency of Communication with Friends and Family: Twice a week    Frequency of Social Gatherings with Friends and Family: Twice a week    Attends Religious Services: 1 to 4 times per year    Active Member of [employer] or Organizations: Yes    Attends Banker Meetings: 1 to 4 times per year    Marital Status: Married  Catering Manager Violence: Not At Risk (04/02/2024)   Epic    Fear of Current or Ex-Partner: No    Emotionally Abused: No    Physically Abused: No    Sexually Abused: No  Depression (PHQ2-9): Low Risk (04/02/2024)   Depression (PHQ2-9)    PHQ-2 Score: 0  Alcohol Screen: Low Risk (03/29/2024)   Alcohol Screen    Last Alcohol Screening Score (AUDIT): 5  Housing: Low Risk (03/29/2024)   Epic    Unable to Pay for Housing in the Last Year: No    Number of Times Moved in the Last Year: 0    Homeless in the Last Year: No  Utilities: Not At Risk (04/02/2024)   Epic    Threatened with loss of utilities: No  Health Literacy: Adequate Health Literacy (04/02/2024)   B1300 Health Literacy    Frequency of need for help with medical instructions: Never    Current Medications[1]  Allergies[2]   ROS  Constitutional: Negative for fever or weight change.  Respiratory: Negative for cough and shortness of  breath.   Cardiovascular: Negative for chest pain or palpitations.  Gastrointestinal: Negative for abdominal pain, no bowel changes.  Musculoskeletal: Negative for gait problem or joint  swelling.  Skin: Negative for rash.  Neurological: Negative for dizziness or headache.  No other specific complaints in a complete review of systems (except as listed in HPI above).   Objective  Vitals:   04/02/24 0745  BP: 112/84  Pulse: 80  Temp: 97.7 F (36.5 C)  SpO2: 98%  Weight: 287 lb (130.2 kg)  Height: 5' 7 (1.702 m)    Body mass index is 44.95 kg/m.  Physical Exam Vitals reviewed.  Constitutional:      Appearance: Normal appearance.  HENT:     Head: Normocephalic.     Right Ear: Tympanic membrane normal.     Left Ear: Tympanic membrane normal.     Nose: Nose normal.  Eyes:     Extraocular Movements: Extraocular movements intact.     Conjunctiva/sclera: Conjunctivae normal.     Pupils: Pupils are equal, round, and reactive to light.  Neck:     Thyroid: No thyroid mass, thyromegaly or thyroid tenderness.  Cardiovascular:     Rate and Rhythm: Normal rate and regular rhythm.     Pulses: Normal pulses.     Heart sounds: Normal heart sounds.  Pulmonary:     Effort: Pulmonary effort is normal.     Breath sounds: Normal breath sounds.  Abdominal:     General: Bowel sounds are normal.     Palpations: Abdomen is soft.  Musculoskeletal:        General: Normal range of motion.     Cervical back: Normal range of motion and neck supple.     Right lower leg: No edema.     Left lower leg: No edema.  Skin:    General: Skin is warm and dry.     Capillary Refill: Capillary refill takes less than 2 seconds.  Neurological:     General: No focal deficit present.     Mental Status: She is alert and oriented to person, place, and time. Mental status is at baseline.  Psychiatric:        Mood and Affect: Mood normal.        Behavior: Behavior normal.        Thought Content: Thought content normal.        Judgment: Judgment normal.        Fall Risk:    04/02/2024    7:46 AM 10/09/2022    7:39 AM 06/27/2022    9:06 AM 05/29/2022    3:15 PM 05/02/2022    8:00 AM  Fall  Risk   Falls in the past year? 0 0 1 1 1   Number falls in past yr: 0 0 0 0 0  Injury with Fall? 0 0  0  1  1   Comment     bruised   Risk for fall due to : No Fall Risks No Fall Risks  History of fall(s) History of fall(s)  Follow up Falls evaluation completed Falls prevention discussed;Education provided;Falls evaluation completed        Data saved with a previous flowsheet row definition     Functional Status Survey: Is the patient deaf or have difficulty hearing?: No Does the patient have difficulty seeing, even when wearing glasses/contacts?: No Does the patient have difficulty concentrating, remembering, or making decisions?: No Does the patient have difficulty walking or climbing stairs?: No Does the patient  have difficulty dressing or bathing?: No Does the patient have difficulty doing errands alone such as visiting a doctor's office or shopping?: No   Assessment & Plan  Problem List Items Addressed This Visit       Digestive   Acid reflux     Other   Obesity   Mixed hyperlipidemia   Other Visit Diagnoses       Immunization due    -  Primary   Relevant Orders   Pneumococcal conjugate vaccine 20-valent (Prevnar 20)   Flu vaccine trivalent PF, 6mos and older(Flulaval,Afluria,Fluarix,Fluzone)     Screening mammogram for breast cancer       Relevant Orders   MM 3D SCREENING MAMMOGRAM BILATERAL BREAST     Screening for diabetes mellitus         Screening for deficiency anemia         Annual physical exam          Assessment and Plan Assessment & Plan Gastroesophageal Reflux Disease Intermittent chest and side pain radiating to the shoulder blade, exacerbated by eating, particularly fatty foods. No shortness of breath or palpitations. Tenderness in the rib area without trauma. Differential includes worsening GERD, gastritis, or hiatal hernia. Previous GERD history. No current Carafate  use. - Restart Carafate  1 g oral four times a day. - Ordered lipase to evaluate  pancreatic function. - Referred to gastroenterology for further evaluation. - Advised to report symptom changes or worsening.  Obesity, class 3 Current weight is 287 pounds with a BMI of 44.95. Previous success with Zepbound , but discontinued due to lifestyle changes. Interested in resuming Zepbound  if symptoms allow. - Advised to send a message in two weeks regarding symptom improvement to determine if Zepbound  can be restarted.   Mixed Hyperlipidemia Previous lipid panel showed hyperlipidemia with LDL at 132 mg/dL. A1c was normal. No current lipid-lowering therapy discussed.  Abnormal cervical cytology, ASCUS Previous Pap smear in 2024 showed ASCUS with negative HPV. Recommended follow-up Pap smear to ensure clearance of abnormal cells. - Advised to schedule follow-up Pap smear with GYN.  General Health Maintenance Due for mammogram. Last dental exam was five months ago, and last eye exam was a year ago. Blood pressure is well-controlled. No colorectal or lung cancer screening needed at this time. - Ordered mammogram. - Encouraged 150 minutes of physical activity per week for heart health.    -USPSTF grade A and B recommendations reviewed with patient; age-appropriate recommendations, preventive care, screening tests, etc discussed and encouraged; healthy living encouraged; see AVS for patient education given to patient -Discussed importance of 150 minutes of physical activity weekly, eat two servings of fish weekly, eat one serving of tree nuts ( cashews, pistachios, pecans, almonds.SABRA) every other day, eat 6 servings of fruit/vegetables daily and drink plenty of water and avoid sweet beverages.   -Reviewed Health Maintenance: yes     [1]  Current Outpatient Medications:    buPROPion  (WELLBUTRIN  XL) 150 MG 24 hr tablet, Take 1 tablet (150 mg total) by mouth daily., Disp: 90 tablet, Rfl: 1   Calcium Carbonate Antacid (TUMS PO), Take by mouth., Disp: , Rfl:    Esomeprazole Magnesium  (NEXIUM 24HR CLEAR MINIS PO), , Disp: , Rfl:    sucralfate  (CARAFATE ) 1 g tablet, Take 1 tablet (1 g total) by mouth 4 (four) times daily -  with meals and at bedtime. (Patient not taking: Reported on 04/02/2024), Disp: 270 tablet, Rfl: 1 [2] No Known Allergies

## 2024-04-03 ENCOUNTER — Ambulatory Visit: Payer: Self-pay | Admitting: Nurse Practitioner

## 2024-04-03 LAB — CBC WITH DIFFERENTIAL/PLATELET
Absolute Lymphocytes: 1459 {cells}/uL (ref 850–3900)
Absolute Monocytes: 393 {cells}/uL (ref 200–950)
Basophils Absolute: 29 {cells}/uL (ref 0–200)
Basophils Relative: 0.5 %
Eosinophils Absolute: 410 {cells}/uL (ref 15–500)
Eosinophils Relative: 7.2 %
HCT: 39.8 % (ref 35.9–46.0)
Hemoglobin: 12.8 g/dL (ref 11.7–15.5)
MCH: 30 pg (ref 27.0–33.0)
MCHC: 32.2 g/dL (ref 31.6–35.4)
MCV: 93.2 fL (ref 81.4–101.7)
MPV: 11.9 fL (ref 7.5–12.5)
Monocytes Relative: 6.9 %
Neutro Abs: 3409 {cells}/uL (ref 1500–7800)
Neutrophils Relative %: 59.8 %
Platelets: 226 10*3/uL (ref 140–400)
RBC: 4.27 Million/uL (ref 3.80–5.10)
RDW: 13.4 % (ref 11.0–15.0)
Total Lymphocyte: 25.6 %
WBC: 5.7 10*3/uL (ref 3.8–10.8)

## 2024-04-03 LAB — COMPREHENSIVE METABOLIC PANEL WITH GFR
AG Ratio: 1.6 (calc) (ref 1.0–2.5)
ALT: 10 U/L (ref 6–29)
AST: 14 U/L (ref 10–30)
Albumin: 3.9 g/dL (ref 3.6–5.1)
Alkaline phosphatase (APISO): 63 U/L (ref 31–125)
BUN: 11 mg/dL (ref 7–25)
CO2: 25 mmol/L (ref 20–32)
Calcium: 8.5 mg/dL — ABNORMAL LOW (ref 8.6–10.2)
Chloride: 104 mmol/L (ref 98–110)
Creat: 0.78 mg/dL (ref 0.50–0.99)
Globulin: 2.4 g/dL (ref 1.9–3.7)
Glucose, Bld: 85 mg/dL (ref 65–99)
Potassium: 4 mmol/L (ref 3.5–5.3)
Sodium: 138 mmol/L (ref 135–146)
Total Bilirubin: 0.3 mg/dL (ref 0.2–1.2)
Total Protein: 6.3 g/dL (ref 6.1–8.1)
eGFR: 97 mL/min/{1.73_m2}

## 2024-04-03 LAB — LIPID PANEL
Cholesterol: 174 mg/dL
HDL: 47 mg/dL — ABNORMAL LOW
LDL Cholesterol (Calc): 109 mg/dL — ABNORMAL HIGH
Non-HDL Cholesterol (Calc): 127 mg/dL
Total CHOL/HDL Ratio: 3.7 (calc)
Triglycerides: 86 mg/dL

## 2024-04-03 LAB — HEMOGLOBIN A1C
Hgb A1c MFr Bld: 5 %
Mean Plasma Glucose: 97 mg/dL
eAG (mmol/L): 5.4 mmol/L

## 2024-04-03 LAB — LIPASE: Lipase: 16 U/L (ref 7–60)

## 2024-04-20 ENCOUNTER — Encounter

## 2024-10-01 ENCOUNTER — Ambulatory Visit: Admitting: Nurse Practitioner
